# Patient Record
Sex: Female | Born: 1965 | Hispanic: No | Marital: Married | Smoking: Never smoker
Health system: Southern US, Community
[De-identification: ages and names within clinical notes are randomized; demographics above are authoritative.]

## PROBLEM LIST (undated history)

## (undated) DIAGNOSIS — E039 Hypothyroidism, unspecified: Secondary | ICD-10-CM

## (undated) DIAGNOSIS — I10 Essential (primary) hypertension: Secondary | ICD-10-CM

## (undated) DIAGNOSIS — Z87898 Personal history of other specified conditions: Secondary | ICD-10-CM

## (undated) DIAGNOSIS — E559 Vitamin D deficiency, unspecified: Secondary | ICD-10-CM

## (undated) DIAGNOSIS — D509 Iron deficiency anemia, unspecified: Secondary | ICD-10-CM

## (undated) DIAGNOSIS — G459 Transient cerebral ischemic attack, unspecified: Secondary | ICD-10-CM

## (undated) HISTORY — DX: Hypothyroidism, unspecified: E03.9

## (undated) HISTORY — DX: Transient cerebral ischemic attack, unspecified: G45.9

## (undated) HISTORY — DX: Essential (primary) hypertension: I10

## (undated) HISTORY — DX: Vitamin D deficiency, unspecified: E55.9

## (undated) HISTORY — DX: Iron deficiency anemia, unspecified: D50.9

## (undated) HISTORY — DX: Personal history of other specified conditions: Z87.898

---

## 2004-10-08 ENCOUNTER — Ambulatory Visit: Payer: Self-pay | Admitting: Internal Medicine

## 2004-10-09 ENCOUNTER — Ambulatory Visit: Payer: Self-pay | Admitting: Internal Medicine

## 2004-10-23 ENCOUNTER — Ambulatory Visit: Payer: Self-pay | Admitting: Internal Medicine

## 2004-10-30 ENCOUNTER — Ambulatory Visit: Payer: Self-pay | Admitting: *Deleted

## 2005-01-18 ENCOUNTER — Ambulatory Visit: Payer: Self-pay | Admitting: Family Medicine

## 2005-01-18 LAB — CONVERTED CEMR LAB

## 2005-01-31 ENCOUNTER — Ambulatory Visit: Payer: Self-pay | Admitting: Internal Medicine

## 2005-02-08 ENCOUNTER — Encounter: Admission: RE | Admit: 2005-02-08 | Discharge: 2005-02-08 | Payer: Self-pay | Admitting: Family Medicine

## 2007-07-22 ENCOUNTER — Encounter (INDEPENDENT_AMBULATORY_CARE_PROVIDER_SITE_OTHER): Payer: Self-pay | Admitting: *Deleted

## 2009-03-15 ENCOUNTER — Telehealth (INDEPENDENT_AMBULATORY_CARE_PROVIDER_SITE_OTHER): Payer: Self-pay | Admitting: Internal Medicine

## 2009-03-17 ENCOUNTER — Emergency Department (HOSPITAL_COMMUNITY): Admission: EM | Admit: 2009-03-17 | Discharge: 2009-03-17 | Payer: Self-pay | Admitting: Emergency Medicine

## 2009-03-31 ENCOUNTER — Ambulatory Visit: Payer: Self-pay | Admitting: Nurse Practitioner

## 2009-03-31 DIAGNOSIS — D649 Anemia, unspecified: Secondary | ICD-10-CM | POA: Insufficient documentation

## 2009-03-31 DIAGNOSIS — R5381 Other malaise: Secondary | ICD-10-CM | POA: Insufficient documentation

## 2009-03-31 DIAGNOSIS — J309 Allergic rhinitis, unspecified: Secondary | ICD-10-CM | POA: Insufficient documentation

## 2009-03-31 DIAGNOSIS — R5383 Other fatigue: Secondary | ICD-10-CM

## 2009-03-31 DIAGNOSIS — N3000 Acute cystitis without hematuria: Secondary | ICD-10-CM | POA: Insufficient documentation

## 2009-03-31 DIAGNOSIS — R002 Palpitations: Secondary | ICD-10-CM | POA: Insufficient documentation

## 2009-03-31 DIAGNOSIS — B351 Tinea unguium: Secondary | ICD-10-CM | POA: Insufficient documentation

## 2009-04-07 DIAGNOSIS — E559 Vitamin D deficiency, unspecified: Secondary | ICD-10-CM | POA: Insufficient documentation

## 2009-04-07 LAB — CONVERTED CEMR LAB
Albumin: 4.2 g/dL (ref 3.5–5.2)
Alkaline Phosphatase: 59 units/L (ref 39–117)
BUN: 9 mg/dL (ref 6–23)
Basophils Absolute: 0 10*3/uL (ref 0.0–0.1)
CO2: 24 meq/L (ref 19–32)
Creatinine, Ser: 0.56 mg/dL (ref 0.40–1.20)
Eosinophils Absolute: 0.1 10*3/uL (ref 0.0–0.7)
Eosinophils Relative: 1 % (ref 0–5)
HCT: 36.2 % (ref 36.0–46.0)
Hemoglobin: 11.2 g/dL — ABNORMAL LOW (ref 12.0–15.0)
Lymphocytes Relative: 17 % (ref 12–46)
MCHC: 30.9 g/dL (ref 30.0–36.0)
MCV: 88.7 fL (ref 78.0–100.0)
Platelets: 397 10*3/uL (ref 150–400)
Potassium: 4.5 meq/L (ref 3.5–5.3)
RDW: 14.1 % (ref 11.5–15.5)
Sodium: 141 meq/L (ref 135–145)
TSH: 3.381 microintl units/mL (ref 0.350–4.500)
Vit D, 25-Hydroxy: 27 ng/mL — ABNORMAL LOW (ref 30–89)
WBC: 7.5 10*3/uL (ref 4.0–10.5)

## 2009-04-11 ENCOUNTER — Encounter (INDEPENDENT_AMBULATORY_CARE_PROVIDER_SITE_OTHER): Payer: Self-pay | Admitting: *Deleted

## 2009-05-18 ENCOUNTER — Encounter (INDEPENDENT_AMBULATORY_CARE_PROVIDER_SITE_OTHER): Payer: Self-pay | Admitting: Nurse Practitioner

## 2009-05-18 ENCOUNTER — Ambulatory Visit: Payer: Self-pay | Admitting: Nurse Practitioner

## 2009-05-18 LAB — CONVERTED CEMR LAB

## 2009-05-19 ENCOUNTER — Encounter (INDEPENDENT_AMBULATORY_CARE_PROVIDER_SITE_OTHER): Payer: Self-pay | Admitting: Nurse Practitioner

## 2009-05-19 DIAGNOSIS — E039 Hypothyroidism, unspecified: Secondary | ICD-10-CM | POA: Insufficient documentation

## 2009-05-24 ENCOUNTER — Ambulatory Visit (HOSPITAL_COMMUNITY): Admission: RE | Admit: 2009-05-24 | Discharge: 2009-05-24 | Payer: Self-pay | Admitting: Internal Medicine

## 2009-05-29 ENCOUNTER — Encounter (INDEPENDENT_AMBULATORY_CARE_PROVIDER_SITE_OTHER): Payer: Self-pay | Admitting: Nurse Practitioner

## 2009-07-07 ENCOUNTER — Ambulatory Visit: Payer: Self-pay | Admitting: Nurse Practitioner

## 2009-07-11 ENCOUNTER — Encounter (INDEPENDENT_AMBULATORY_CARE_PROVIDER_SITE_OTHER): Payer: Self-pay | Admitting: Nurse Practitioner

## 2010-05-28 ENCOUNTER — Ambulatory Visit (HOSPITAL_COMMUNITY): Admission: RE | Admit: 2010-05-28 | Discharge: 2010-05-28 | Payer: Self-pay | Admitting: Internal Medicine

## 2010-06-22 IMAGING — MG MM DIGITAL SCREENING BILAT
4 series · 4 of 4 positions shown · non-contrast
Comparison: none

DG SCREEN MAMMOGRAM BILATERAL
Bilateral CC and MLO view(s) were taken.
Technologist: Jose Enrique Joo, RT RM

DIGITAL SCREENING MAMMOGRAM WITH CAD:
There are scattered fibroglandular densities.  No masses or malignant type calcifications are 
identified.  Compared with prior studies.
Images were processed with CAD.

[R CC]
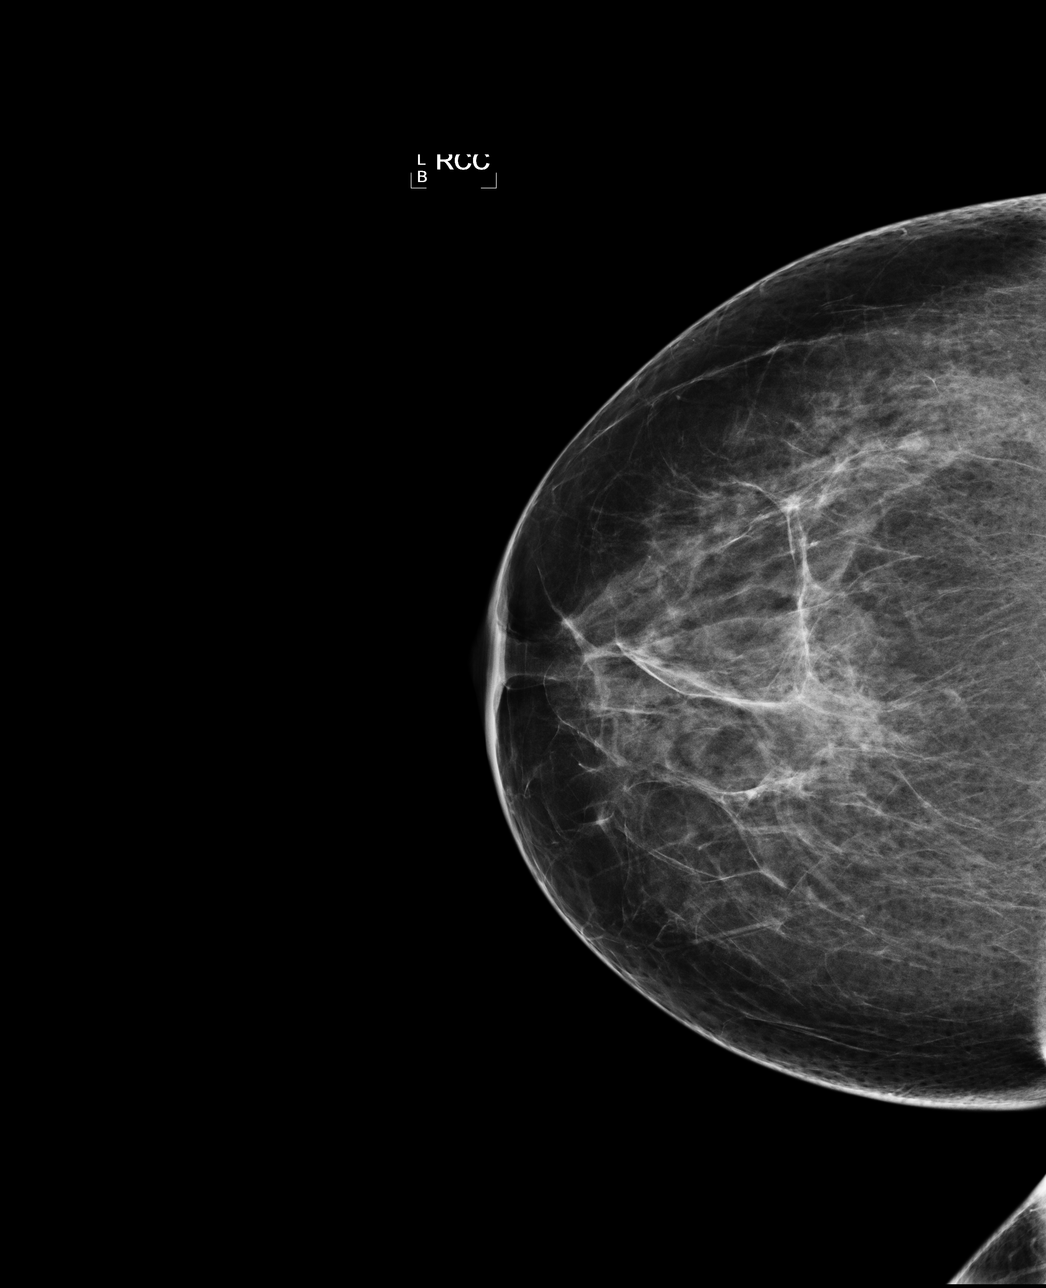

[R MLO]
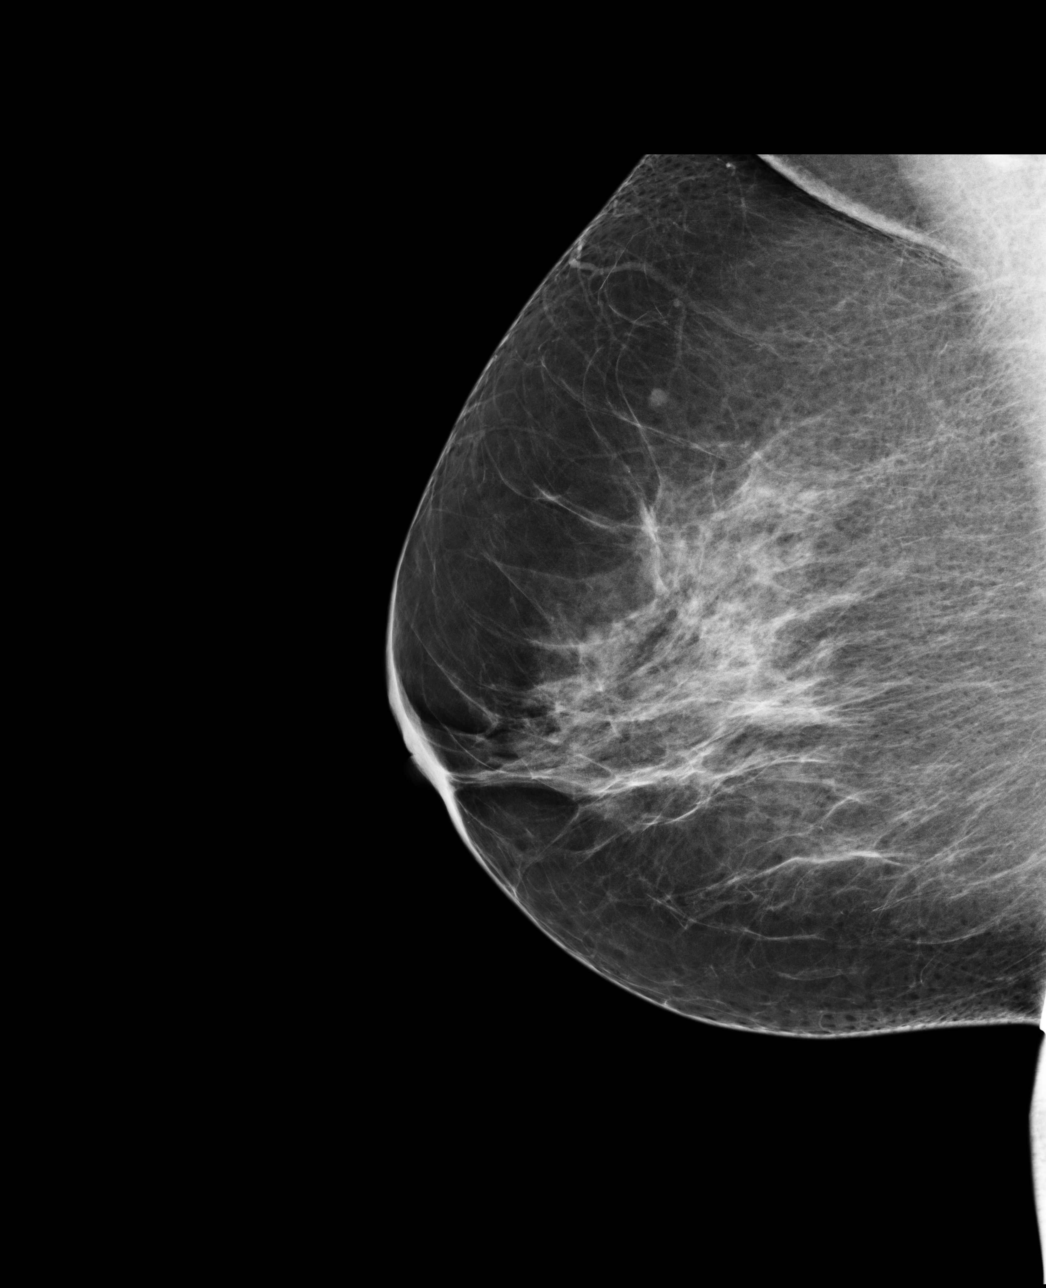

[L CC]
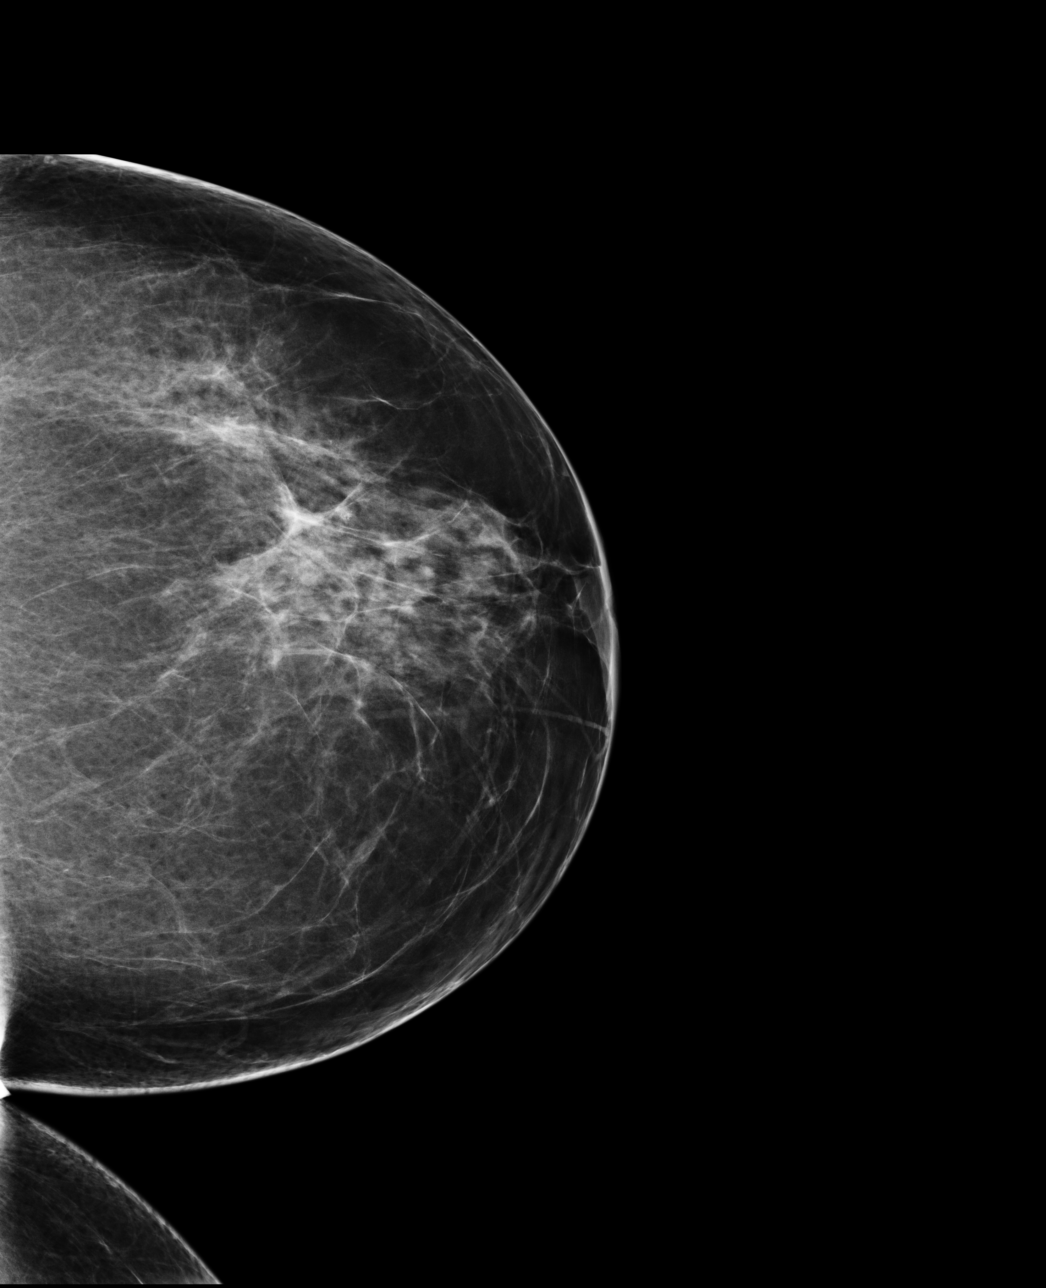

[L MLO]
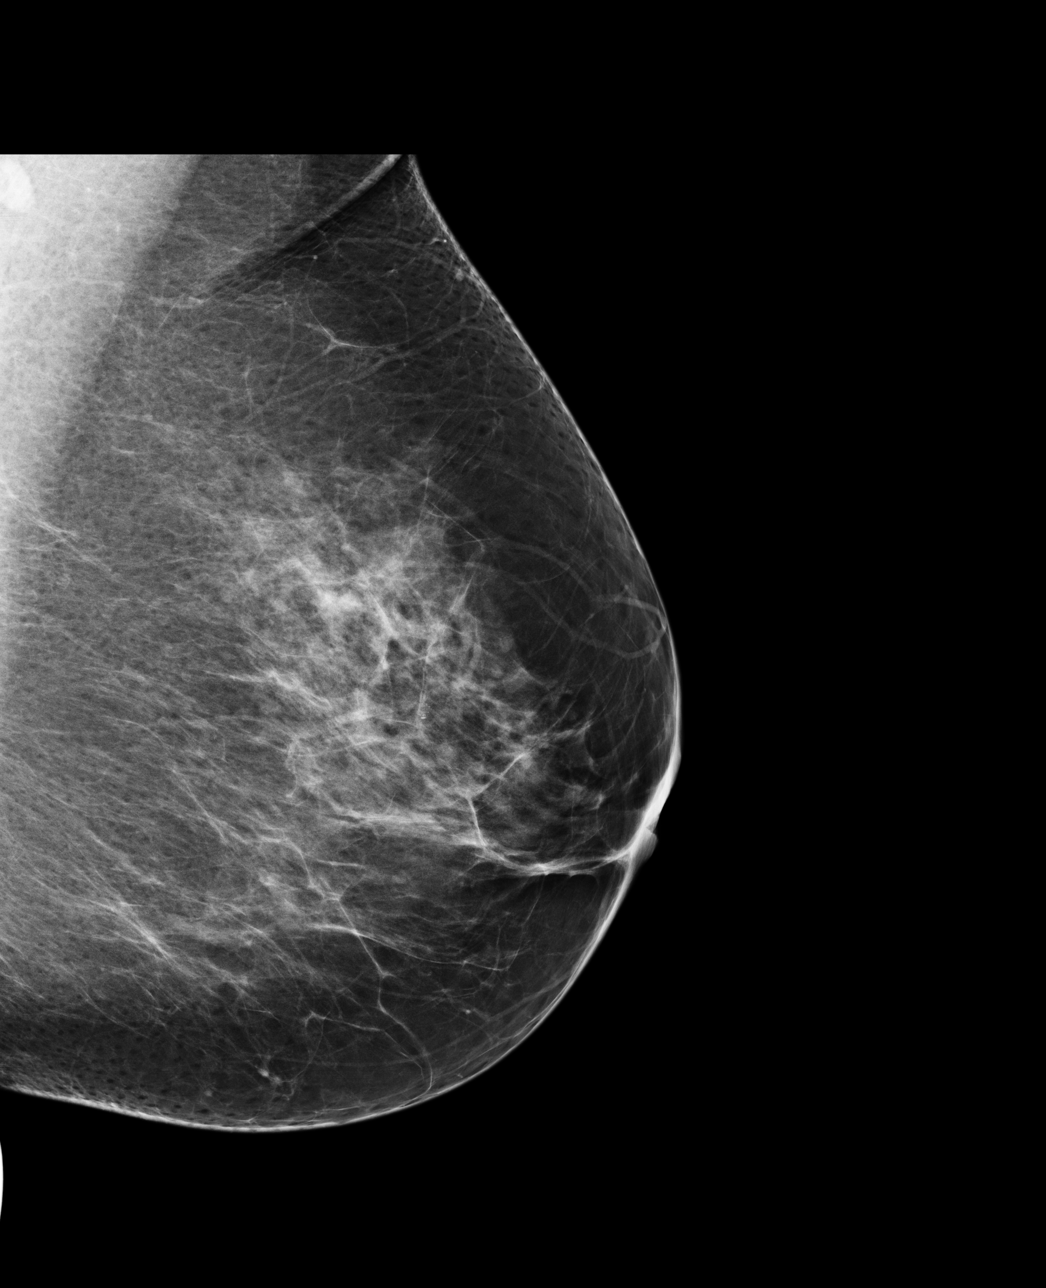

[4 of 4 positions shown; findings below may reference images not displayed]

IMPRESSION: No specific mammographic evidence of malignancy.  Next screening mammogram is recommended in one 
year.

A result letter of this screening mammogram will be mailed directly to the patient.

ASSESSMENT: Negative - BI-RADS 1

Screening mammogram in 1 year.
ANALYZED BY COMPUTER AIDED DETECTION. , THIS PROCEDURE WAS A DIGITAL MAMMOGRAM.

## 2010-10-19 ENCOUNTER — Encounter (INDEPENDENT_AMBULATORY_CARE_PROVIDER_SITE_OTHER): Payer: Self-pay | Admitting: Nurse Practitioner

## 2010-10-19 ENCOUNTER — Ambulatory Visit: Payer: Self-pay | Admitting: Nurse Practitioner

## 2010-10-19 DIAGNOSIS — N926 Irregular menstruation, unspecified: Secondary | ICD-10-CM | POA: Insufficient documentation

## 2010-10-19 DIAGNOSIS — K029 Dental caries, unspecified: Secondary | ICD-10-CM | POA: Insufficient documentation

## 2010-10-19 LAB — CONVERTED CEMR LAB
KOH Prep: NEGATIVE
OCCULT 1: NEGATIVE
Protein, U semiquant: NEGATIVE
Rapid HIV Screen: NEGATIVE
Specific Gravity, Urine: >=1.03
Urobilinogen, UA: 0.2
WBC Urine, dipstick: NEGATIVE
pH: 5

## 2010-10-22 ENCOUNTER — Encounter (INDEPENDENT_AMBULATORY_CARE_PROVIDER_SITE_OTHER): Payer: Self-pay | Admitting: Nurse Practitioner

## 2010-10-22 ENCOUNTER — Telehealth (INDEPENDENT_AMBULATORY_CARE_PROVIDER_SITE_OTHER): Payer: Self-pay | Admitting: Nurse Practitioner

## 2010-10-22 ENCOUNTER — Ambulatory Visit: Payer: Self-pay | Admitting: Nurse Practitioner

## 2010-10-22 LAB — CONVERTED CEMR LAB
AST: 17 units/L (ref 0–37)
Albumin: 4.3 g/dL (ref 3.5–5.2)
Chlamydia, DNA Probe: NEGATIVE
Chloride: 101 meq/L (ref 96–112)
Creatinine, Ser: 0.57 mg/dL (ref 0.40–1.20)
Ferritin: 10 ng/mL (ref 10–291)
Folate: >20 ng/mL
GC Probe Amp, Genital: NEGATIVE
HCT: 35.8 % — ABNORMAL LOW (ref 36.0–46.0)
HDL: 44 mg/dL (ref >39)
Hemoglobin: 11.3 g/dL — ABNORMAL LOW (ref 12.0–15.0)
Iron: 73 ug/dL (ref 42–145)
Lymphocytes Relative: 20 % (ref 12–46)
Lymphs Abs: 1.6 10*3/uL (ref 0.7–4.0)
MCV: 88.4 fL (ref 78.0–100.0)
Microalb, Ur: 1.16 mg/dL (ref 0.00–1.89)
Monocytes Absolute: 0.6 10*3/uL (ref 0.1–1.0)
Monocytes Relative: 8 % (ref 3–12)
Neutrophils Relative %: 70 % (ref 43–77)
Platelets: 412 10*3/uL — ABNORMAL HIGH (ref 150–400)
UIBC: 285 ug/dL
Vit D, 25-Hydroxy: 30 ng/mL (ref 30–89)
Vitamin B-12: 428 pg/mL (ref 211–911)

## 2010-10-23 ENCOUNTER — Encounter (INDEPENDENT_AMBULATORY_CARE_PROVIDER_SITE_OTHER): Payer: Self-pay | Admitting: Nurse Practitioner

## 2010-10-24 ENCOUNTER — Encounter (INDEPENDENT_AMBULATORY_CARE_PROVIDER_SITE_OTHER): Payer: Self-pay | Admitting: Nurse Practitioner

## 2010-11-09 ENCOUNTER — Encounter (INDEPENDENT_AMBULATORY_CARE_PROVIDER_SITE_OTHER): Payer: Self-pay | Admitting: Nurse Practitioner

## 2010-12-02 LAB — CONVERTED CEMR LAB
ALT: 12 units/L (ref 0–35)
AST: 15 units/L (ref 0–37)
CO2: 20 meq/L (ref 19–32)
Chlamydia, DNA Probe: NEGATIVE
Cholesterol: 168 mg/dL (ref 0–200)
Eosinophils Absolute: 0.1 10*3/uL (ref 0.0–0.7)
Eosinophils Relative: 1 % (ref 0–5)
GC Probe Amp, Genital: NEGATIVE
HCT: 36.1 % (ref 36.0–46.0)
HDL: 45 mg/dL (ref >39)
Hemoglobin: 11.3 g/dL — ABNORMAL LOW (ref 12.0–15.0)
Ketones, urine, test strip: NEGATIVE
LDL Cholesterol: 94 mg/dL (ref 0–99)
Lymphocytes Relative: 16 % (ref 12–46)
Lymphs Abs: 1.3 10*3/uL (ref 0.7–4.0)
MCHC: 31.3 g/dL (ref 30.0–36.0)
Monocytes Absolute: 0.6 10*3/uL (ref 0.1–1.0)
Neutro Abs: 6 10*3/uL (ref 1.7–7.7)
Nitrite: NEGATIVE
OCCULT 1: NEGATIVE
Potassium: 4.5 meq/L (ref 3.5–5.3)
RBC: 4.04 M/uL (ref 3.87–5.11)
Specific Gravity, Urine: 1.01
Total CHOL/HDL Ratio: 3.7
Total Protein: 7 g/dL (ref 6.0–8.3)
Triglycerides: 146 mg/dL (ref <150)
Urobilinogen, UA: 1
WBC Urine, dipstick: NEGATIVE
WBC: 7.9 10*3/uL (ref 4.0–10.5)
pH: 6

## 2010-12-06 NOTE — Assessment & Plan Note (Signed)
Summary: Complete Physical Exam    Vital Signs:  Patient profile:   45 year old female Menstrual status:  regular LMP:     09/15/2010 Weight:      162.5 pounds BMI:     29.59 Temp:     98.6 degrees F oral Pulse rate:   72 / minute Pulse rhythm:   regular Resp:     16 per minute BP sitting:   130 / 76  (left arm) Cuff size:   regular  Vitals Entered By: Levon Hedger (October 19, 2010 8:51 AM)  Nutrition Counseling: Patient's BMI is greater than 25 and therefore counseled on weight management options. CC: CPP.Marland KitchenMarland Kitchenpt has been without medications for 6 months Is Patient Diabetic? No Pain Assessment Patient in pain? no       Does patient need assistance? Functional Status Self care Ambulation Normal  Vision Screening:Left eye w/o correction: 20 / 20-1 Right Eye w/o correction: 20 / 20-1 Both eyes w/o correction:  20/ 20-1        Vision Entered By: Levon Hedger (October 19, 2010 9:23 AM) LMP (date): 09/15/2010 LMP - Character: normal     Menstrual flow (days): 5 Menstrual Status regular Enter LMP: 09/15/2010 Last PAP Result  Specimen Adequacy: Satisfactory for evaluation.   Interpretation/Result:Negative for intraepithelial Lesion or Malignancy.      CC:  CPP.Marland KitchenMarland Kitchenpt has been without medications for 6 months.  History of Present Illness:  Pt into the office for complete physical exam  PAP - Last done 1year ago.  Normal results 3 children and married (husband present with pt today)  Mammogram - last done in july 2011  no family history of breast cancer  Optho - No glasses or contacts. No recent eye exam   Dental - some broken teeth and pt requesting a dental referral  Obesity - up 5 pounds since the last visit.    Family Planning - LMP 09/15/2010 - pt is concerned that she is 4 days late for her cycle.  No current birth control method.  Habits & Providers  Alcohol-Tobacco-Diet     Alcohol drinks/day: 0     Tobacco Status:  never  Exercise-Depression-Behavior     Does Patient Exercise: no     Have you felt down or hopeless? no     Have you felt little pleasure in things? no     Depression Counseling: not indicated; screening negative for depression     Drug Use: never  Allergies (verified): No Known Drug Allergies  Social History: Does Patient Exercise:  no  Review of Systems General:  Denies fever. Eyes:  Denies blurring. ENT:  Denies earache. CV:  Complains of palpitations; pt has been off her atenolol for about 3 months. . Resp:  Denies cough. GI:  Denies abdominal pain, nausea, and vomiting. GU:  Denies discharge. MS:  Denies joint pain. Derm:  Denies dryness. Neuro:  Denies headaches. Psych:  Denies anxiety and depression.  Physical Exam  General:  alert.   Head:  normocephalic.   Eyes:  pupils equal and pupils round.   Ears:  bil TM with bony landmarks present Nose:  nose piercing noted.   Mouth:  fair dentition.   upper partial but left upper molar broken Neck:  supple.   Chest Wall:  no mass.   Breasts:  nipple Lungs:  normal breath sounds.   Heart:  normal rate and regular rhythm.   Abdomen:  non-tender and normal bowel sounds.   Rectal:  external hemorrhoid(s).  Msk:  up to the exam table Extremities:  no edema Neurologic:  alert & oriented X3 and gait normal.   Skin:  color normal.   Psych:  Oriented X3.    Pelvic Exam  Vulva:      normal appearance.   Urethra and Bladder:      Urethra--no discharge.   Vagina:      physiologic discharge.   Cervix:      midposition.   Uterus:      smooth.   Adnexa:      nontender bilaterally.   Rectum:      normal, heme negative stool, + external hemorrhoids.      Impression & Recommendations:  Problem # 1:  ROUTINE GYNECOLOGICAL EXAMINATION (ICD-V72.31) labs done except lipids (pt is not fasting) rec optho and dental exam PAP done  mammogram already done for this year Orders: KOH/ WET Mount [phone]) Pap Smear, Thin  Prep ( Collection of) [phone]) T- GC Chlamydia (72536) T-Urine Microalbumin w/creat. ratio [phone]) T-Comprehensive Metabolic Panel [phone]) T-CBC w/Diff (18841-66063) Rapid HIV  (92370) Hemoccult Guaiac-1 spec.(in office) (82270) Vision Screening (01601)  Problem # 2:  NEED PROPHYLACTIC VACCINATION&INOCULATION FLU (ICD-V04.81) given today in office  Problem # 3:  HYPOTHYROIDISM (ICD-244.9) will check labs today Orders: T-TSH ([id number])  Problem # 4:  PALPITATIONS (ICD-785.1) sinus tachy today on EKG will restart atenolol Her updated medication list for this problem includes:    Atenolol 25 Mg Tabs (Atenolol) ..... One tablet by mouth daily for heart  Orders: EKG w/ Interpretation (93000)  Problem # 5:  IRREGULAR MENSES (ICD-626.4) urine pregnancy negative advised pt that she should look to getting an IUD - handout given Orders: Urine Pregnancy Test  (09323)  Complete Medication List: 1)  Allegra 180 Mg Tabs (Fexofenadine hcl) .Marland Kitchen.. 1 tablet daily as needed for allergies 2)  Atenolol 25 Mg Tabs (Atenolol) .... One tablet by mouth daily for heart  Other Orders: Dental Referral (Dentist) T-Vitamin D 25-Hydroxy & 1,25 Dihydroxy (8147) Flu Vaccine 77yrs + (55732) Admin 1st Vaccine (20254)  Patient Instructions: 1)  Schedule a lab visit on Monday for fasting lab - lipids 2)  No food after midnight before this visit.  No coffee with milk.  May drink water. 3)  You should consider a long term birth control such as IUD given your age.  You are starting to have some changes with your period that may vary from month to month. 4)  You will be referred to the Dental Clinic - they will call you with time/date of the appointment 5)  You have been given the flu vaccine today. 6)  Follow up with provider yearly or sooner if necessary Prescriptions: ATENOLOL 25 MG TABS (ATENOLOL) One tablet by mouth daily for heart  #30 x 11   Entered and Authorized by:   Lehman Prom FNP   Signed by:   Lehman Prom FNP on 10/19/2010   Method used:   Print then Give to Patient   RxID:   [id number]    Orders Added: 1)  Est. Patient age 44-64 [99396] 2)  EKG w/ Interpretation [93000] 3)  KOH/ WET Mount [16073] 4)  Pap Smear, Thin Prep ( Collection of) [Q0091] 5)  T- GC Chlamydia [71062] 6)  T-Urine Microalbumin w/creat. ratio [[id number]] 7)  T-Comprehensive Metabolic Panel [80053-22900] 8)  T-CBC w/Diff [69485-46270] 9)  Rapid HIV  [92370] 10)  T-TSH [35009-38182] 11)  Hemoccult Guaiac-1 spec.(in office) [82270] 12)  Vision Screening [99173] 13)  Dental Referral [Dentist] 14)  T-Vitamin D 25-Hydroxy & 1,25 Dihydroxy [8147] 15)  Urine Pregnancy Test  [81025] 16)  Flu Vaccine 71yrs + [90658] 17)  Admin 1st Vaccine [90471]   Immunizations Administered:  Influenza Vaccine # 1:    Vaccine Type: Fluvax 3+    Site: left deltoid    Mfr: GlaxoSmithKline    Dose: 0.5 ml    Route: IM    Given by: Gaylyn Cheers RN    Exp. Date: 05/04/2011    Lot #: [id number]    VIS given: 05/29/10 version given October 19, 2010.  Flu Vaccine Consent Questions:    Do you have a history of severe allergic reactions to this vaccine? no    Any prior history of allergic reactions to egg and/or gelatin? no    Do you have a sensitivity to the preservative Thimersol? no    Do you have a past history of Guillan-Barre Syndrome? no    Do you currently have an acute febrile illness? no    Have you ever had a severe reaction to latex? no    Vaccine information given and explained to patient? yes    Are you currently pregnant? no   Immunizations Administered:  Influenza Vaccine # 1:    Vaccine Type: Fluvax 3+    Site: left deltoid    Mfr: GlaxoSmithKline    Dose: 0.5 ml    Route: IM    Given by: Gaylyn Cheers RN    Exp. Date: 05/04/2011    Lot #: [id number]    VIS given: 05/29/10 version given October 19, 2010.  Prevention & Chronic  Care Immunizations   Influenza vaccine: Fluvax 3+  (10/19/2010)    Tetanus booster: 11/05/2003: given in [place]    Pneumococcal vaccine: Not documented  Other Screening   Pap smear:  Specimen Adequacy: Satisfactory for evaluation.   Interpretation/Result:Negative for intraepithelial Lesion or Malignancy.     (05/18/2009)    Mammogram: ASSESSMENT: Negative - BI-RADS 1^MM DIGITAL SCREENING  (05/28/2010)   Mammogram action/deferral: Screening mammogram in 1 year.     (02/08/2005)   Smoking status: never  (10/19/2010)  Lipids   Total Cholesterol: 168  (05/18/2009)   LDL: 94  (05/18/2009)   LDL Direct: Not documented   HDL: 45  (05/18/2009)   Triglycerides: 146  (05/18/2009)   Nursing Instructions: Give Flu vaccine today    EKG  Procedure date:  10/19/2010  Findings:      sinus tachy   Laboratory Results   Urine Tests  Date/Time Received: October 19, 2010 10:40 AM   Routine Urinalysis   Color: lt. yellow Glucose: negative   (Normal Range: Negative) Bilirubin: negative   (Normal Range: Negative) Ketone: negative   (Normal Range: Negative) Spec. Gravity: >=1.030   (Normal Range: 1.003-1.035) Blood: moderate   (Normal Range: Negative) pH: 5.0   (Normal Range: 5.0-8.0) Protein: negative   (Normal Range: Negative) Urobilinogen: 0.2   (Normal Range: 0-1) Nitrite: negative   (Normal Range: Negative) Leukocyte Esterace: negative   (Normal Range: Negative)    Date/Time Received: October 19, 2010 10:14 AM   Wet Mount Source: vaginal WBC/hpf: 1-5 Bacteria/hpf: rare Clue cells/hpf: none Yeast/hpf: none Wet Mount KOH: Negative Trichomonas/hpf: none  Other Tests  Rapid HIV: negative  Stool - Occult Blood Hemmoccult #1: negative Date: 10/19/2010      Laboratory Results   Urine Tests    Routine Urinalysis   Color: lt. yellow Glucose: negative   (Normal Range: Negative) Bilirubin: negative   (  Normal Range: Negative) Ketone: negative    (Normal Range: Negative) Spec. Gravity: >=1.030   (Normal Range: 1.003-1.035) Blood: moderate   (Normal Range: Negative) pH: 5.0   (Normal Range: 5.0-8.0) Protein: negative   (Normal Range: Negative) Urobilinogen: 0.2   (Normal Range: 0-1) Nitrite: negative   (Normal Range: Negative) Leukocyte Esterace: negative   (Normal Range: Negative)      Wet Mount/KOH  Other Tests  Rapid HIV: negative

## 2010-12-06 NOTE — Progress Notes (Signed)
Summary: Office Visit//DEPRESSION SCREENING  Office Visit//DEPRESSION SCREENING   Imported By: Arta Bruce 10/19/2010 10:52:34  _____________________________________________________________________  External Attachment:    Type:   Image     Comment:   External Document

## 2010-12-06 NOTE — Letter (Signed)
Summary: Handout Printed  Printed Handout:  - Contraceptive Devices (IUD's)

## 2010-12-06 NOTE — Progress Notes (Signed)
Summary: Anemia  Phone Note Outgoing Call   Summary of Call: advise pt that she is anemic she will need to start ferrous sulfate 325mg  by mouth daily. She can buy this over the counter  Initial call taken by: Lehman Prom FNP,  October 22, 2010 4:29 PM  Follow-up for Phone Call        Levon Hedger  October 24, 2010 12:59 PM Left message on machine for pt to return call to the office.  LM on [phone] for pt. to call us.... Hale Drone CMA  October 26, 2010 8:59 AM   Additional Follow-up for Phone Call Additional follow up Details #1::        Left message on answer machine for pt. to return call. Gaylyn Cheers RN  October 30, 2010 2:39 PM        Additional Follow-up for Phone Call Additional follow up Details #2::    PATIENT RETURNED CALL AND SPOKE WITH HER SON AND SHE IS AWARE OF HER ABOVE INFO. Follow-up by: Leodis Rains,  October 31, 2010 10:29 AM  New/Updated Medications: FERROUS SULFATE 325 (65 FE) MG TBEC (FERROUS SULFATE) One tablet by mouth daily to build up blood Phone Note Outgoing Call   Summary of Call: advise pt that she is anemic she will need to start ferrous sulfate 325mg  by mouth daily. She can buy this over the counter  Initial call taken by: Lehman Prom FNP,  October 22, 2010 4:29 PM  Follow-up for Phone Call        Levon Hedger  October 24, 2010 12:59 PM Left message on machine for pt to return call to the office.  LM on [phone] for pt. to call us.... Hale Drone CMA  October 26, 2010 8:59 AM   Additional Follow-up for Phone Call Additional follow up Details #1::        Left message on answer machine for pt. to return call. Gaylyn Cheers RN  October 30, 2010 2:39 PM        Additional Follow-up for Phone Call Additional follow up Details #2::    PATIENT RETURNED CALL AND SPOKE WITH HER SON AND SHE IS AWARE OF HER ABOVE INFO. Follow-up by: Leodis Rains,  October 31, 2010 10:29 AM  New/Updated Medications: FERROUS  SULFATE 325 (65 FE) MG TBEC (FERROUS SULFATE) One tablet by mouth daily to build up blood

## 2010-12-06 NOTE — Letter (Signed)
Summary: *HSN Results Follow up  [facility]  [address] [place], [place] [address]   Phone: [phone]  Fax: [phone]      10/24/2010   [facility] [address] [place], [place]  [address]   Dear  Ms. Shirley Craig,                            ____S.Drinkard,FNP   ____D. Gore,FNP       ____B. McPherson,MD   ____V. Rankins,MD    ____E. Mulberry,MD    _X___N. Daphine Deutscher, FNP  ____D. Reche Dixon, MD    ____K. Philipp Deputy, MD    ____Other     This letter is to inform you that your recent test(s):  ___X____Pap Smear    _______Lab Test     _______X-ray    ___X____ is within acceptable limits  _______ requires a medication change  _______ requires a follow-up lab visit  _______ requires a follow-up visit with your [name]   Comments: Pap Smear results are normal.       _________________________________________________________ If you have any questions, please contact our office [phone].                    Sincerely,    Shirley Prom FNP [facility]

## 2010-12-06 NOTE — Letter (Signed)
Summary: DENTAL REFERRAL  DENTAL REFERRAL   Imported By: Arta Bruce 11/06/2010 16:30:54  _____________________________________________________________________  External Attachment:    Type:   Image     Comment:   External Document

## 2010-12-06 NOTE — Letter (Signed)
Summary: Lipid Letter  [facility]  [address] [place], [place] [address]   Phone: [phone]  Fax: [phone]    10/23/2010  [facility] [address] [place] [place], [place]  [address]  Dear Shirley Craig:  We have carefully reviewed your last lipid profile from 10/22/2010 and the results are noted below with a summary of recommendations for lipid management.    Cholesterol:       187     Goal: less than 200   HDL "good" Cholesterol:   44     Goal: greater than 40   LDL "bad" Cholesterol:   124     Goal: less than 130   Triglycerides:       96     Goal: less than 150    Your cholesterol labs are normal.    Current Medications: 1)    Allegra 180 Mg Tabs (Fexofenadine hcl) .Marland Kitchen.. 1 tablet daily as needed for allergies 2)    Atenolol 25 Mg Tabs (Atenolol) .... One tablet by mouth daily for heart  If you have any questions, please call. We appreciate being able to work with you.   Sincerely,    [facility] Lehman Prom FNP

## 2011-02-12 LAB — URINALYSIS, ROUTINE W REFLEX MICROSCOPIC
Bilirubin Urine: NEGATIVE
Ketones, ur: NEGATIVE mg/dL
Nitrite: NEGATIVE
Protein, ur: 100 mg/dL — AB

## 2011-02-12 LAB — URINE CULTURE

## 2011-02-12 LAB — URINE MICROSCOPIC-ADD ON

## 2015-11-15 ENCOUNTER — Other Ambulatory Visit: Payer: Self-pay | Admitting: Family Medicine

## 2015-11-15 DIAGNOSIS — R1012 Left upper quadrant pain: Secondary | ICD-10-CM

## 2015-11-17 ENCOUNTER — Ambulatory Visit
Admission: RE | Admit: 2015-11-17 | Discharge: 2015-11-17 | Disposition: A | Discharge status: Home / Self Care | Payer: BLUE CROSS/BLUE SHIELD | Source: Ambulatory Visit | Attending: Family Medicine | Admitting: Family Medicine

## 2015-11-17 DIAGNOSIS — R1012 Left upper quadrant pain: Secondary | ICD-10-CM

## 2017-01-20 ENCOUNTER — Other Ambulatory Visit: Payer: Self-pay | Admitting: Physician Assistant

## 2017-01-20 ENCOUNTER — Other Ambulatory Visit (HOSPITAL_COMMUNITY)
Admission: RE | Admit: 2017-01-20 | Discharge: 2017-01-20 | Disposition: A | Discharge status: Home / Self Care | Payer: BLUE CROSS/BLUE SHIELD | Source: Ambulatory Visit | Attending: Family Medicine | Admitting: Family Medicine

## 2017-01-20 DIAGNOSIS — Z01419 Encounter for gynecological examination (general) (routine) without abnormal findings: Secondary | ICD-10-CM | POA: Diagnosis present

## 2017-01-22 LAB — CYTOLOGY - PAP: DIAGNOSIS: NEGATIVE

## 2017-07-09 ENCOUNTER — Other Ambulatory Visit: Payer: Self-pay | Admitting: Physician Assistant

## 2017-07-09 ENCOUNTER — Ambulatory Visit
Admission: RE | Admit: 2017-07-09 | Discharge: 2017-07-09 | Disposition: A | Discharge status: Home / Self Care | Payer: BLUE CROSS/BLUE SHIELD | Source: Ambulatory Visit | Attending: Physician Assistant | Admitting: Physician Assistant

## 2017-07-09 DIAGNOSIS — S99912A Unspecified injury of left ankle, initial encounter: Secondary | ICD-10-CM

## 2018-11-25 ENCOUNTER — Emergency Department (HOSPITAL_COMMUNITY)
Admission: EM | Admit: 2018-11-25 | Discharge: 2018-11-25 | Disposition: A | Discharge status: Home / Self Care | Payer: BLUE CROSS/BLUE SHIELD | Attending: Emergency Medicine | Admitting: Emergency Medicine

## 2018-11-25 ENCOUNTER — Other Ambulatory Visit: Payer: Self-pay

## 2018-11-25 ENCOUNTER — Emergency Department (HOSPITAL_COMMUNITY): Payer: BLUE CROSS/BLUE SHIELD

## 2018-11-25 ENCOUNTER — Encounter (HOSPITAL_COMMUNITY): Payer: Self-pay

## 2018-11-25 DIAGNOSIS — Y92410 Unspecified street and highway as the place of occurrence of the external cause: Secondary | ICD-10-CM | POA: Insufficient documentation

## 2018-11-25 DIAGNOSIS — E039 Hypothyroidism, unspecified: Secondary | ICD-10-CM | POA: Insufficient documentation

## 2018-11-25 DIAGNOSIS — Y9389 Activity, other specified: Secondary | ICD-10-CM | POA: Insufficient documentation

## 2018-11-25 DIAGNOSIS — S20219A Contusion of unspecified front wall of thorax, initial encounter: Secondary | ICD-10-CM | POA: Insufficient documentation

## 2018-11-25 DIAGNOSIS — Y999 Unspecified external cause status: Secondary | ICD-10-CM | POA: Insufficient documentation

## 2018-11-25 DIAGNOSIS — R030 Elevated blood-pressure reading, without diagnosis of hypertension: Secondary | ICD-10-CM | POA: Insufficient documentation

## 2018-11-25 NOTE — ED Notes (Signed)
Patient instructed on use of incentive spirometer. Teach back method used. 
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
## 2018-11-25 NOTE — Discharge Instructions (Signed)
Please use incentive spirometer to help keep your lungs healthy Take Ibuprofen for pain as needed Use a heating pad on sore areas Please follow up with your doctor Return if worsening

## 2021-09-17 NOTE — Progress Notes (Signed)
NEUROLOGY CONSULTATION NOTE  Shirley Craig MRN: [id number] DOB: April 14, 1966   Referring provider: Almedia Balls, NP   Primary care provider: Almedia Balls, NP     Reason for consult:  TIA   Assessment/Plan:   TIA history of TIA July 04, 2021, with resolution, no recurrence.  Recent history of long plane ride.  CT head, 2D echo, carotid ultrasound unremarkable.  Lipid panel normal, LDL 66.  History of bradycardia and palpitations.  Patient on daily baby aspirin  Recommendations Check MRI/MRA head  echo bubble study to rule out PFO CT angio chest rule out PE MRA/MRI brain to evaluate structural abnormalities and vascular load Bilateral lower extremity Doppler to rule out clot  Patient is scheduled to follow-up with cardiology regarding history of bradycardia Follow-up on anemia with PCP  Subjective:   Shirley Craig is a pleasant 55 y.o. [place] woman with a history of Vit D deficiency, history of bradycardia, hypertension anemia referred by Almedia Balls, NP for continuation of care after having a TIA while in [place] on 07/04/2021, 24 hours after she had arrived to [place] in an 8-hour ride by plane.  During the plane ride, she experienced left lower extremity swelling, and pain at the ankle area, as well as chest tightness.  Very mild shortness of breath.  No dizziness or vertigo.  On presentation to the hospital, the patient had right arm weakness and numbness and tingling in her fingers, as well as numbness paralysis on the right side of her face.  She denies any trouble swallowing, or vision changes such as amaurosis fugax.  She denies any headaches.  No loss of consciousness.  The experience lasted about 2 minutes, with complete resolution of the symptoms.  At the hospital, CT of the head, 2D echo and carotid ultrasound, as well as EKG were performed, and with the exception of sinus bradycardia, the rest of the work-up was negative.  She was discharged on baby  aspirin called Aspegic and upon return, this was changed to 81 mg ASA.  She denies any fever, chills night sweats, she denies any tobacco, or alcohol abuse.  She denies recreational drug intake.    Labs 09/12/2021: Iron 43, TIBC 356, iron saturation 12, Hemoglobin 11.5, hematocrit 34.3, MCV 85.9, platelets 423 CMP normal TSH 3.57 normal Vitamin D 2543, normal Lipid panel normal  PAST MEDICAL HISTORY: Past Medical History:  Diagnosis Date   History of bradycardia    History of palpitations    Hypertension    Hypothyroidism    Iron deficiency anemia    TIA (transient ischemic attack)    Vitamin D deficiency     PAST SURGICAL HISTORY: History reviewed. No pertinent surgical history.  MEDICATIONS: Current Outpatient Medications on File Prior to Visit  Medication Sig Dispense Refill   aspirin EC 81 MG tablet Take 81 mg by mouth daily. Swallow whole.     candesartan (ATACAND) 8 MG tablet Take 8 mg by mouth daily.     FEROSUL 325 (65 Fe) MG tablet Take 325 mg by mouth daily.     rosuvastatin (CRESTOR) 10 MG tablet Take 10 mg by mouth daily.     No current facility-administered medications on file prior to visit.    ALLERGIES: No Known Allergies  FAMILY HISTORY: Family History  Problem Relation Age of Onset   Hypertension Mother    Hypertension Father      Objective:   General: No acute distress.  Patient appears well-groomed.   Head:  Normocephalic/atraumatic Eyes:  fundi examined but not visualized Neck: supple, no paraspinal tenderness, full range of motion Back: No paraspinal tenderness Heart: regular rate and rhythm Lungs: Clear to auscultation bilaterally. Vascular: No carotid bruits. Neurological Exam: Mental status: alert and oriented to person, place, and time, recent and remote memory intact, fund of knowledge intact, attention and concentration intact, speech fluent and not dysarthric, language intact. Cranial nerves: CN I: not tested CN II: pupils equal,  round and reactive to light, visual fields intact CN III, IV, VI:  full range of motion, no nystagmus, no ptosis CN V: facial sensation intact. CN VII: upper and lower face symmetric CN VIII: hearing intact CN IX, X: gag intact, uvula midline CN XI: sternocleidomastoid and trapezius muscles intact CN XII: tongue midline Bulk & Tone: normal, no fasciculations. Motor:  muscle strength 5/5 throughout Sensation:  Pinprick, temperature and vibratory sensation intact. Deep Tendon Reflexes:  2+ throughout,  toes downgoing.   Finger to nose testing:  Without dysmetria.   Heel to shin:  Without dysmetria.   Gait:  Normal station and stride.  Romberg negative.    Thank you for allowing me to take part in the care of this patient.  Marlowe Kays, PA-C   CC: Carilyn Goodpasture, NP

## 2021-09-18 ENCOUNTER — Ambulatory Visit (INDEPENDENT_AMBULATORY_CARE_PROVIDER_SITE_OTHER): Payer: 59 | Admitting: Physician Assistant

## 2021-09-18 ENCOUNTER — Encounter: Payer: Self-pay | Admitting: Physician Assistant

## 2021-09-18 ENCOUNTER — Other Ambulatory Visit: Payer: Self-pay

## 2021-09-18 DIAGNOSIS — Z8673 Personal history of transient ischemic attack (TIA), and cerebral infarction without residual deficits: Secondary | ICD-10-CM | POA: Insufficient documentation

## 2021-09-18 NOTE — Patient Instructions (Addendum)
Follow up in 3 months Continue Baby Aspirin daily  Echo Bubble study for PFO CT A  chest B lower extremity dopplers to check the legs  MRI/MRA brain to look for structure and vessels  Exercise often Follow with Cardiology for bradycardia ad palpitations Follow up on your anemia with your primary doctor

## 2021-09-19 ENCOUNTER — Other Ambulatory Visit: Payer: Self-pay | Admitting: Family Medicine

## 2021-09-19 DIAGNOSIS — Z1231 Encounter for screening mammogram for malignant neoplasm of breast: Secondary | ICD-10-CM

## 2021-09-21 ENCOUNTER — Other Ambulatory Visit: Payer: Self-pay

## 2021-09-21 ENCOUNTER — Telehealth: Payer: Self-pay

## 2021-09-21 ENCOUNTER — Telehealth: Payer: Self-pay | Admitting: Physician Assistant

## 2021-09-21 ENCOUNTER — Ambulatory Visit (HOSPITAL_COMMUNITY)
Admission: RE | Admit: 2021-09-21 | Discharge: 2021-09-21 | Disposition: A | Discharge status: Home / Self Care | Payer: 59 | Source: Ambulatory Visit | Attending: Physician Assistant | Admitting: Physician Assistant

## 2021-09-21 DIAGNOSIS — Z8673 Personal history of transient ischemic attack (TIA), and cerebral infarction without residual deficits: Secondary | ICD-10-CM | POA: Insufficient documentation

## 2021-09-21 NOTE — Telephone Encounter (Signed)
Pt is returning a call to someone about results.  

## 2021-09-21 NOTE — Telephone Encounter (Signed)
I contacted patient, verbally understood

## 2021-09-21 NOTE — Progress Notes (Signed)
Lower extremity venous bilateral study completed.  Preliminary results relayed to [facility] for [place], [place].  See CV Proc for preliminary results report.   Jean Rosenthal, RDMS, RVT

## 2021-09-21 NOTE — Telephone Encounter (Signed)
Left message for patient to call back at 11:22am

## 2021-09-21 NOTE — Telephone Encounter (Signed)
Patient advise of no clot.

## 2021-09-21 NOTE — Telephone Encounter (Signed)
Received call from [facility] stating that patient was negative for any deep clot. She did have some superficial compressible areas on her leg but they stated patient was negative for blood clot I agreed to release patient at that time

## 2021-09-24 ENCOUNTER — Ambulatory Visit (HOSPITAL_COMMUNITY)
Admission: RE | Admit: 2021-09-24 | Discharge: 2021-09-24 | Disposition: A | Discharge status: Home / Self Care | Payer: 59 | Source: Ambulatory Visit | Attending: Physician Assistant | Admitting: Physician Assistant

## 2021-09-24 ENCOUNTER — Other Ambulatory Visit: Payer: Self-pay

## 2021-09-24 DIAGNOSIS — Z8673 Personal history of transient ischemic attack (TIA), and cerebral infarction without residual deficits: Secondary | ICD-10-CM | POA: Diagnosis present

## 2021-09-24 LAB — POCT I-STAT CREATININE: Creatinine, Ser: 0.7 mg/dL (ref 0.44–1.00)

## 2021-09-24 MED ORDER — IOHEXOL 350 MG/ML SOLN
80.0000 mL | Freq: Once | INTRAVENOUS | Status: AC | PRN
  Administered 2021-09-24: 75 mL via INTRAVENOUS

## 2021-09-24 MED ORDER — GADOBUTROL 1 MMOL/ML IV SOLN
8.0000 mL | Freq: Once | INTRAVENOUS | Status: AC | PRN
  Administered 2021-09-24: 8 mL via INTRAVENOUS

## 2021-10-25 ENCOUNTER — Other Ambulatory Visit: Payer: Self-pay

## 2021-10-25 ENCOUNTER — Ambulatory Visit (HOSPITAL_COMMUNITY)
Admission: RE | Admit: 2021-10-25 | Discharge: 2021-10-25 | Disposition: A | Discharge status: Home / Self Care | Payer: 59 | Source: Ambulatory Visit | Attending: Physician Assistant | Admitting: Physician Assistant

## 2021-10-25 DIAGNOSIS — I10 Essential (primary) hypertension: Secondary | ICD-10-CM

## 2021-10-25 DIAGNOSIS — Z8673 Personal history of transient ischemic attack (TIA), and cerebral infarction without residual deficits: Secondary | ICD-10-CM

## 2021-10-25 DIAGNOSIS — Q2112 Patent foramen ovale: Secondary | ICD-10-CM | POA: Diagnosis not present

## 2021-10-25 DIAGNOSIS — I071 Rheumatic tricuspid insufficiency: Secondary | ICD-10-CM | POA: Diagnosis not present

## 2021-10-25 LAB — ECHOCARDIOGRAM COMPLETE BUBBLE STUDY
Area-P 1/2: 4.49 cm2
Calc EF: 60.2 %
S' Lateral: 2.8 cm
Single Plane A2C EF: 59.1 %
Single Plane A4C EF: 59.3 %

## 2021-10-25 NOTE — Progress Notes (Signed)
°  Echocardiogram 2D Echocardiogram has been performed.  Augustine Radar 10/25/2021, 9:01 AM

## 2021-11-08 ENCOUNTER — Ambulatory Visit: Payer: 59

## 2021-12-05 ENCOUNTER — Other Ambulatory Visit: Payer: Self-pay

## 2021-12-05 ENCOUNTER — Ambulatory Visit (INDEPENDENT_AMBULATORY_CARE_PROVIDER_SITE_OTHER): Payer: 59

## 2021-12-05 DIAGNOSIS — Z1231 Encounter for screening mammogram for malignant neoplasm of breast: Secondary | ICD-10-CM | POA: Diagnosis not present

## 2021-12-19 ENCOUNTER — Encounter: Payer: Self-pay | Admitting: Physician Assistant

## 2021-12-19 ENCOUNTER — Other Ambulatory Visit: Payer: Self-pay

## 2021-12-19 ENCOUNTER — Ambulatory Visit (INDEPENDENT_AMBULATORY_CARE_PROVIDER_SITE_OTHER): Payer: 59 | Admitting: Physician Assistant

## 2021-12-19 VITALS — BP 115/77 | HR 79 | Resp 20 | Ht 62.0 in | Wt 160.0 lb

## 2021-12-19 DIAGNOSIS — Q2112 Patent foramen ovale: Secondary | ICD-10-CM

## 2021-12-19 DIAGNOSIS — Z8673 Personal history of transient ischemic attack (TIA), and cerebral infarction without residual deficits: Secondary | ICD-10-CM | POA: Diagnosis not present

## 2021-12-19 NOTE — Progress Notes (Signed)
NEUROLOGY FOLLOW UP OFFICE NOTE  Dekeisha Casablanca [id number]  Assessment/Plan:   TIA history of TIA July 04, 2021, with resolution, no recurrence.  Echo bubble study was positive with shunting observed within 3-6 cardiac cycles suggestive of interatrial shunt. There is a moderately sized patent foramen ovale with predominantly right to left shunting across the atrial septum.  Patient has been referred to cardiology by her PCP in view of this findings but  patient failed to follow-up, has been instructed to adhere to the plan due to risk of recurrence, history of bradycardia and palpitations.  Patient continues to be on daily baby aspirin. CT head, carotid ultrasound unremarkable.  Lipid panel normal, LDL 66. Lower extremity ultrasound negative for DVT. 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  CT angio of the chest was negative for PE, but aortic atherosclerosis was noted.  Recommend follow-up with Cardiology for PFO, bradycardia and palpitations by her PCP Follow-up on anemia with PCP Follow-up with [place] as needed   Case discussed with Dr. Tomi Likens, who agrees with the plan.  Subjective:   Patient returns today in follow-up after the pertinent work-up for TIA has been concluded.  She denies any new strokelike symptoms.  Denies any chest pain or palpitations at this time.  She is on aspirin daily.  She denies any vertigo, dizziness or vision changes, headache, dysarthria or dysphagia.  No further long distance trips.  No lower extremity pain.  She denies any chest pain or shortness of breath, or recent infections.  Patient is compliant with her other medications, she is active.  She sleeps better since starting using CPAP after a diagnosis in January of OSA.  Initial visit 09/18/21 Shirley Craig is a pleasant 56 y.o. [place] woman with a history of Vit D deficiency, history of bradycardia, hypertension  anemia referred by Almedia Balls, NP for continuation of care after having a TIA while in [place] on 07/04/2021, 24 hours after she had arrived to [place] in an 8-hour ride by plane.  During the plane ride, she experienced left lower extremity swelling, and pain at the ankle area, as well as chest tightness.  Very mild shortness of breath.  No dizziness or vertigo.  On presentation to the hospital, the patient had right arm weakness and numbness and tingling in her fingers, as well as numbness paralysis on the right side of her face.  She denies any trouble swallowing, or vision changes such as amaurosis fugax.  She denies any headaches.  No loss of consciousness.  The experience lasted about 2 minutes, with complete resolution of the symptoms.  At the hospital, CT of the head, 2D echo and carotid ultrasound, as well as EKG were performed, and with the exception of sinus bradycardia, the rest of the work-up was negative.  She was discharged on baby aspirin called Aspegic and upon return, this was changed to 81 mg ASA.  She denies any fever, chills night sweats, she denies any tobacco, or alcohol abuse.  She denies recreational drug intake.    Labs 09/12/2021: Iron 43, TIBC 356, iron saturation 12, Hemoglobin 11.5, hematocrit 34.3, MCV 85.9, platelets 423 CMP normal TSH 3.57 normal Vitamin D 2543, normal Lipid panel normal  PAST MEDICAL HISTORY: Past Medical History:  Diagnosis Date   History of bradycardia    History of palpitations    Hypertension    Hypothyroidism    Iron deficiency anemia    TIA (transient  ischemic attack)    Vitamin D deficiency     MEDICATIONS: Current Outpatient Medications on File Prior to Visit  Medication Sig Dispense Refill   aspirin EC 81 MG tablet Take 81 mg by mouth daily. Swallow whole.     candesartan (ATACAND) 8 MG tablet Take 8 mg by mouth daily.     FEROSUL 325 (65 Fe) MG tablet Take 325 mg by mouth daily.     rosuvastatin (CRESTOR) 10 MG tablet Take 10 mg  by mouth daily.     No current facility-administered medications on file prior to visit.    ALLERGIES: No Known Allergies  FAMILY HISTORY: Family History  Problem Relation Age of Onset   Hypertension Mother    Hypertension Father       Objective:   General: No acute distress.  Patient appears well groomed.   Head:  Normocephalic/atraumatic Eyes:  Fundi examined but not visualized Neck: supple, no paraspinal tenderness, full range of motion Heart:  Regular rate and rhythm, soft murmur at the left parasternal area Lungs:  Clear to auscultation bilaterally Back: No paraspinal tenderness Neurological Exam: alert and oriented to person, place, and time. Attention span and concentration intact, recent and remote memory intact, fund of knowledge intact.  Speech fluent and not dysarthric, language intact.  CN II-XII intact. Bulk and tone normal, muscle strength 5/5 throughout.  Sensation to light touch, temperature and vibration intact.  Deep tendon reflexes 2+ throughout, toes downgoing.  Finger to nose and heel to shin testing intact.  Gait normal, Romberg negative.     Metta Clines, DO  CC: Almedia Balls, NP

## 2021-12-19 NOTE — Patient Instructions (Addendum)
Follow up as needed  Continue Baby Aspirin daily Follow with Cardiology for PFO  bradycardia and palpitations Follow up on your anemia with your primary doctor

## 2021-12-31 ENCOUNTER — Ambulatory Visit (INDEPENDENT_AMBULATORY_CARE_PROVIDER_SITE_OTHER): Payer: 59 | Admitting: Cardiology

## 2021-12-31 ENCOUNTER — Ambulatory Visit (INDEPENDENT_AMBULATORY_CARE_PROVIDER_SITE_OTHER): Payer: 59

## 2021-12-31 ENCOUNTER — Encounter: Payer: Self-pay | Admitting: Cardiology

## 2021-12-31 ENCOUNTER — Other Ambulatory Visit: Payer: Self-pay

## 2021-12-31 VITALS — BP 114/74 | HR 73 | Ht 62.0 in | Wt 159.6 lb

## 2021-12-31 DIAGNOSIS — I10 Essential (primary) hypertension: Secondary | ICD-10-CM | POA: Diagnosis not present

## 2021-12-31 DIAGNOSIS — Z79899 Other long term (current) drug therapy: Secondary | ICD-10-CM

## 2021-12-31 DIAGNOSIS — R002 Palpitations: Secondary | ICD-10-CM | POA: Diagnosis not present

## 2021-12-31 DIAGNOSIS — Q2112 Patent foramen ovale: Secondary | ICD-10-CM

## 2021-12-31 DIAGNOSIS — I253 Aneurysm of heart: Secondary | ICD-10-CM | POA: Insufficient documentation

## 2021-12-31 DIAGNOSIS — Z8673 Personal history of transient ischemic attack (TIA), and cerebral infarction without residual deficits: Secondary | ICD-10-CM

## 2021-12-31 DIAGNOSIS — Z9989 Dependence on other enabling machines and devices: Secondary | ICD-10-CM

## 2021-12-31 DIAGNOSIS — G4733 Obstructive sleep apnea (adult) (pediatric): Secondary | ICD-10-CM

## 2021-12-31 LAB — CBC WITH DIFFERENTIAL/PLATELET
Basophils Absolute: 0.1 10*3/uL (ref 0.0–0.2)
Basos: 1 %
EOS (ABSOLUTE): 0.1 10*3/uL (ref 0.0–0.4)
Eos: 2 %
Hematocrit: 35.1 % (ref 34.0–46.6)
Hemoglobin: 11.5 g/dL (ref 11.1–15.9)
Immature Grans (Abs): 0 10*3/uL (ref 0.0–0.1)
Immature Granulocytes: 0 %
Lymphocytes Absolute: 1.6 10*3/uL (ref 0.7–3.1)
Lymphs: 28 %
MCH: 28.7 pg (ref 26.6–33.0)
MCHC: 32.8 g/dL (ref 31.5–35.7)
MCV: 88 fL (ref 79–97)
Monocytes Absolute: 0.5 10*3/uL (ref 0.1–0.9)
Monocytes: 10 %
Neutrophils Absolute: 3.2 10*3/uL (ref 1.4–7.0)
Neutrophils: 59 %
Platelets: 436 10*3/uL (ref 150–450)
RBC: 4.01 x10E6/uL (ref 3.77–5.28)
RDW: 13.5 % (ref 11.7–15.4)
WBC: 5.5 10*3/uL (ref 3.4–10.8)

## 2021-12-31 LAB — BASIC METABOLIC PANEL
BUN/Creatinine Ratio: 15 (ref 9–23)
BUN: 9 mg/dL (ref 6–24)
CO2: 26 mmol/L (ref 20–29)
Calcium: 9.2 mg/dL (ref 8.7–10.2)
Chloride: 104 mmol/L (ref 96–106)
Creatinine, Ser: 0.59 mg/dL (ref 0.57–1.00)
Glucose: 93 mg/dL (ref 70–99)
Potassium: 4.7 mmol/L (ref 3.5–5.2)
Sodium: 140 mmol/L (ref 134–144)
eGFR: 105 mL/min/{1.73_m2} (ref >59)

## 2021-12-31 LAB — MAGNESIUM: Magnesium: 2.2 mg/dL (ref 1.6–2.3)

## 2021-12-31 NOTE — Progress Notes (Signed)
Cardiology Office Note:    Date:  12/31/2021   ID:  Shirley Craig, DOB 1966/10/21, MRN [id number]  PCP:  Carilyn Goodpasture, NP  Cardiologist:  Thomasene Ripple, DO  Electrophysiologist:  None   Referring MD: Carilyn Goodpasture, NP   " I am am having palpitations"  History of Present Illness:    Shirley Craig is a 56 y.o. female with a hx of hypertension, hypothyroidism, iron deficiency anemia, OSA now on CPAP is here today to be evaluated for TIA and reported PFO.  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  During that time she noted that everything was good.  The next day when she was at home she started to feel some shortness of breath.  But then she noticed that her right arm started a few weeks she has some numbness and tingling.  For like the right side of her face was paralyzed.  She was taken to the hospital there she had testing done which are reported to be normal.  She was found to have sinus bradycardia.  She was started on baby aspirin as well as a statin.  After she came back to the [place] she was seen by her PCP and echo was repeated here showing a patent foramen ovale, she was also tested for sleep apnea and was found to have sleep apnea and started on CPAP.  She was then referred to cardiology.  She tells me today that she has been experiencing intermittent palpitations.  Was some lightheadedness.  No chest pain but does have intermittent shortness of breath.  No other complaints at this time.  She is here in office with her husband, but her visit was facilitated by in person interpreter Renae Fickle).     Past Medical History:  Diagnosis Date   History of bradycardia    History of palpitations    Hypertension    Hypothyroidism    Iron deficiency anemia    TIA (transient ischemic attack)    Vitamin D deficiency     No past surgical history on file.  Current Medications: Current Meds  Medication Sig    aspirin EC 81 MG tablet Take 81 mg by mouth daily. Swallow whole.   candesartan (ATACAND) 8 MG tablet Take 8 mg by mouth daily.   FEROSUL 325 (65 Fe) MG tablet Take 325 mg by mouth daily.   rosuvastatin (CRESTOR) 10 MG tablet Take 10 mg by mouth daily.     Allergies:   Patient has no known allergies.   Social History   Socioeconomic History   Marital status: Married    Spouse name: Not on file   Number of children: 3   Years of education: 12   Highest education level: Not on file  Occupational History   Not on file  Tobacco Use   Smoking status: Never   Smokeless tobacco: Never  Vaping Use   Vaping Use: Never used  Substance and Sexual Activity   Alcohol use: Never   Drug use: Never   Sexual activity: Not on file  Other Topics Concern   Not on file  Social History Narrative   Right handed   Drinks caffeine   One story home   Social Determinants of Health   Financial Resource Strain: Not on file  Food Insecurity: Not on file  Transportation Needs: Not on file  Physical Activity: Not on file  Stress: Not on file  Social Connections:  Not on file     Family History: The patient's family history includes Hypertension in her father and mother.  ROS:   Review of Systems  Constitution: Negative for decreased appetite, fever and weight gain.  HENT: Negative for congestion, ear discharge, hoarse voice and sore throat.   Eyes: Negative for discharge, redness, vision loss in right eye and visual halos.  Cardiovascular: Report palpitations.  Negative for chest pain, dyspnea on exertion, leg swelling, orthopnea. Respiratory: Negative for cough, hemoptysis, shortness of breath and snoring.   Endocrine: Negative for heat intolerance and polyphagia.  Hematologic/Lymphatic: Negative for bleeding problem. Does not bruise/bleed easily.  Skin: Negative for flushing, nail changes, rash and suspicious lesions.  Musculoskeletal: Negative for arthritis, joint pain, muscle cramps,  myalgias, neck pain and stiffness.  Gastrointestinal: Negative for abdominal pain, bowel incontinence, diarrhea and excessive appetite.  Genitourinary: Negative for decreased libido, genital sores and incomplete emptying.  Neurological: Negative for brief paralysis, focal weakness, headaches and loss of balance.  Psychiatric/Behavioral: Negative for altered mental status, depression and suicidal ideas.  Allergic/Immunologic: Negative for HIV exposure and persistent infections.    EKGs/Labs/Other Studies Reviewed:    The following studies were reviewed today:   EKG:  The ekg ordered today demonstrates sinus rhythm, heart rate 73 bpm with P wave morphology suggesting left atrial enlargement.  TTE 10/25/2021 IMPRESSIONS     1. Left ventricular ejection fraction, by estimation, is 60 to 65%. The  left ventricle has normal function. The left ventricle has no regional  wall motion abnormalities. Left ventricular diastolic parameters are  consistent with Grade I diastolic  dysfunction (impaired relaxation).   2. Right ventricular systolic function is normal. The right ventricular  size is normal. There is normal pulmonary artery systolic pressure.   3. The mitral valve is normal in structure. No evidence of mitral valve  regurgitation. No evidence of mitral stenosis.   4. The aortic valve is normal in structure. Aortic valve regurgitation is  not visualized. No aortic stenosis is present.   5. The inferior vena cava is normal in size with greater than 50%  respiratory variability, suggesting right atrial pressure of 3 mmHg.   6. Agitated saline contrast bubble study was positive with shunting  observed within 3-6 cardiac cycles suggestive of interatrial shunt. There  is a moderately sized patent foramen ovale with predominantly right to  left shunting across the atrial septum.   FINDINGS   Left Ventricle: Left ventricular ejection fraction, by estimation, is 60  to 65%. The left  ventricle has normal function. The left ventricle has no  regional wall motion abnormalities. The left ventricular internal cavity  size was normal in size. There is   no left ventricular hypertrophy. Left ventricular diastolic parameters  are consistent with Grade I diastolic dysfunction (impaired relaxation).   Right Ventricle: The right ventricular size is normal. No increase in  right ventricular wall thickness. Right ventricular systolic function is  normal. There is normal pulmonary artery systolic pressure. The tricuspid  regurgitant velocity is 2.15 m/s, and   with an assumed right atrial pressure of 3 mmHg, the estimated right  ventricular systolic pressure is 21.5 mmHg.   Left Atrium: Left atrial size was normal in size.   Right Atrium: Right atrial size was normal in size.   Pericardium: There is no evidence of pericardial effusion.   Mitral Valve: The mitral valve is normal in structure. No evidence of mitral valve regurgitation. No evidence of mitral valve stenosis.  Tricuspid Valve: The tricuspid valve is normal in structure. Tricuspid valve regurgitation is mild . No evidence of tricuspid stenosis.   Aortic Valve: The aortic valve is normal in structure. Aortic valve regurgitation is not visualized. No aortic stenosis is present.   Pulmonic Valve: The pulmonic valve was normal in structure. Pulmonic valve regurgitation is not visualized. No evidence of pulmonic stenosis.   Aorta: The aortic root is normal in size and structure.   Venous: The inferior vena cava is normal in size with greater than 50% respiratory variability, suggesting right atrial pressure of 3 mmHg.   IAS/Shunts: There is redundancy of the interatrial septum. No atrial level shunt detected by color flow Doppler. Agitated saline contrast was given  intravenously to evaluate for intracardiac shunting. Agitated saline contrast bubble study was positive  with shunting observed within 3-6 cardiac cycles  suggestive of interatrial shunt. A moderately sized patent foramen ovale is detected with  predominantly right to left shunting across the atrial septum.       Recent Labs: 09/24/2021: Creatinine, Ser 0.70  Recent Lipid Panel    Component Value Date/Time   CHOL 187 10/22/2010 2112   TRIG 96 10/22/2010 2112   HDL 44 10/22/2010 2112   CHOLHDL 4.3 Ratio 10/22/2010 2112   VLDL 19 10/22/2010 2112   LDLCALC 124 (H) 10/22/2010 2112    Physical Exam:    VS:  BP 114/74    Pulse 73    Ht 5\' 2"  (1.575 m)    Wt 159 lb 9.6 oz (72.4 kg)    LMP 11/25/2018    SpO2 96%    BMI 29.19 kg/m     Wt Readings from Last 3 Encounters:  12/31/21 159 lb 9.6 oz (72.4 kg)  12/19/21 160 lb (72.6 kg)  09/18/21 159 lb (72.1 kg)     GEN: Well nourished, well developed in no acute distress HEENT: Normal NECK: No JVD; No carotid bruits LYMPHATICS: No lymphadenopathy CARDIAC: S1S2 noted,RRR, no murmurs, rubs, gallops RESPIRATORY:  Clear to auscultation without rales, wheezing or rhonchi  ABDOMEN: Soft, non-tender, non-distended, +bowel sounds, no guarding. EXTREMITIES: No edema, No cyanosis, no clubbing MUSCULOSKELETAL:  No deformity  SKIN: Warm and dry NEUROLOGIC:  Alert and oriented x 3, non-focal PSYCHIATRIC:  Normal affect, good insight  ASSESSMENT:    1. PFO (patent foramen ovale)   2. Palpitations   3. History of TIA (transient ischemic attack)   4. Primary hypertension   5. OSA on CPAP   6. Medication management    PLAN:     I would like to rule out a cardiovascular etiology of this palpitation, therefore at this time I would like to placed a zio patch for 14  days.   Her echocardiogram did show evidence of a patent foramen ovale.  With a history of TIA it will be best to get a TEE to be able to get a good understanding of her PFO.  Shared Decision Making/Informed Consent The risks [esophageal damage, perforation (1:10,000 risk), bleeding, pharyngeal hematoma as well as other potential  complications associated with conscious sedation including aspiration, arrhythmia, respiratory failure and death], benefits (treatment guidance and diagnostic support) and alternatives of a transesophageal echocardiogram were discussed in detail with Ms. Fukuda and she is willing to proceed.    Blood pressure is acceptable, continue with current antihypertensive regimen.  Hyperlipidemia - continue with current statin medication.  Continue with CPAP  The patient is in agreement with the above plan. The patient left the office  in stable condition.  The patient will follow up in   Medication Adjustments/Labs and Tests Ordered: Current medicines are reviewed at length with the patient today.  Concerns regarding medicines are outlined above.  Orders Placed This Encounter  Procedures   Basic Metabolic Panel (BMET)   Magnesium   CBC with Differential/Platelet   LONG TERM MONITOR (3-14 DAYS)   EKG 12-Lead   No orders of the defined types were placed in this encounter.   Patient Instructions  Medication Instructions:  Your physician recommends that you continue on your current medications as directed. Please refer to the Current Medication list given to you today.  *If you need a refill on your cardiac medications before your next appointment, please call your pharmacy*   Lab Work: Your physician recommends that you return for lab work in:  TODAY: BMET, Mag, CBC  Testing/Procedures: ZIO XT- Long Term Monitor Instructions  Your physician has requested you wear a ZIO patch monitor for 14 days.  This is a single patch monitor. [employer] supplies one patch monitor per enrollment. Additional stickers are not available. Please do not apply patch if you will be having a Nuclear Stress Test,  Echocardiogram, Cardiac CT, MRI, or Chest Xray during the period you would be wearing the  monitor. The patch cannot be worn during these tests. You cannot remove and re-apply the  ZIO XT patch  monitor.  Your ZIO patch monitor will be mailed 3 day USPS to your address on file. It may take 3-5 days  to receive your monitor after you have been enrolled.  Once you have received your monitor, please review the enclosed instructions. Your monitor  has already been registered assigning a specific monitor serial # to you.  Billing and Patient Assistance Program Information  We have supplied [employer] with any of your insurance information on file for billing purposes. [employer] offers a sliding scale Patient Assistance Program for patients that do not have  insurance, or whose insurance does not completely cover the cost of the ZIO monitor.  You must apply for the Patient Assistance Program to qualify for this discounted rate.  To apply, please call [employer] at [phone], select option 4, select option 2, ask to apply for  Patient Assistance Program. Meredeth [employer] will ask your household income, and how many people  are in your household. They will quote your out-of-pocket cost based on that information.  [employer] will also be able to set up a 1623-month, interest-free payment plan if needed.  Applying the monitor   Shave hair from upper left chest.  Hold abrader disc by orange tab. Rub abrader in 40 strokes over the upper left chest as  indicated in your monitor instructions.  Clean area with 4 enclosed alcohol pads. Let dry.  Apply patch as indicated in monitor instructions. Patch will be placed under collarbone on left  side of chest with arrow pointing upward.  Rub patch adhesive wings for 2 minutes. Remove white label marked "1". Remove the white  label marked "2". Rub patch adhesive wings for 2 additional minutes.  While looking in a mirror, press and release button in center of patch. A small green light will  flash 3-4 times. This will be your only indicator that the monitor has been turned on.  Do not shower for the first 24 hours. You may shower after the first 24 hours.  Press the  button if you feel a symptom. You will hear a small click. Record Date, Time and  Symptom in the Patient Logbook.  When you are ready to remove the patch, follow instructions on the last 2 pages of Patient  Logbook. Stick patch monitor onto the last page of Patient Logbook.  Place Patient Logbook in the blue and white box. Use locking tab on box and tape box closed  securely. The blue and white box has prepaid postage on it. Please place it in the mailbox as  soon as possible. Your physician should have your test results approximately 7 days after the  monitor has been mailed back to [facility].  Call [facility] Customer Care at [phone] if you have questions regarding  your ZIO XT patch monitor. Call them immediately if you see an orange light blinking on your  monitor.  If your monitor falls off in less than 4 days, contact our Monitor department at [phone].  If your monitor becomes loose or falls off after 4 days call [employer] at [phone] for  suggestions on securing your monitor  If you have labs (blood work) drawn today and your tests are completely normal, you will receive your results only by: MyChart Message (if you have MyChart) OR A paper copy in the mail If you have any lab test that is abnormal or we need to change your treatment, we will call you to review the results.  You are scheduled for a TEE on March 1st with Dr. Eden Emms.  Please arrive at the [facility] (Main Entrance A) at [facility]: [address] [place], [place] [address] at 11 am. (1 hour prior to procedure unless lab work is needed; if lab work is needed arrive 1.5 hours ahead)  DIET: Nothing to eat or drink after midnight except a sip of water with medications (see medication instructions below)  FYI: For your safety, and to allow [place] to monitor your vital signs accurately during the surgery/procedure we request that   if you have artificial nails, gel coating, SNS etc. Please have  those removed prior to your surgery/procedure. Not having the nail coverings /polish removed may result in cancellation or delay of your surgery/procedure.  You must have a responsible person to drive you home and stay in the waiting area during your procedure. Failure to do so could result in cancellation.  Bring your insurance cards.  *Special Note: Every effort is made to have your procedure done on time. Occasionally there are emergencies that occur at the hospital that may cause delays. Please be patient if a delay does occur.    Follow-Up:  Your next appointment:   4 month(s)  The format for your next appointment:   In Person  Provider:   Thomasene Ripple, DO     Other Instructions     Adopting a Healthy Lifestyle.  Know what a healthy weight is for you (roughly BMI <25) and aim to maintain this   Aim for 7+ servings of fruits and vegetables daily   65-80+ fluid ounces of water or unsweet tea for healthy kidneys   Limit to max 1 drink of alcohol per day; avoid smoking/tobacco   Limit animal fats in diet for cholesterol and heart health - choose grass fed whenever available   Avoid highly processed foods, and foods high in saturated/trans fats   Aim for low stress - take time to unwind and care for your mental health   Aim for 150 min of moderate intensity exercise weekly for heart health, and weights twice weekly for bone health   Aim for  7-9 hours of sleep daily   When it comes to diets, agreement about the perfect plan isnt easy to find, even among the experts. Experts at the [facility] of [employer] developed an idea known as the Healthy Eating Plate. Just imagine a plate divided into logical, healthy portions.   The emphasis is on diet quality:   Load up on vegetables and fruits - one-half of your plate: Aim for color and variety, and remember that potatoes dont count.   Go for whole grains - one-quarter of your plate: Whole wheat, barley, wheat  berries, quinoa, oats, brown rice, and foods made with them. If you want pasta, go with whole wheat pasta.   Protein power - one-quarter of your plate: Fish, chicken, beans, and nuts are all healthy, versatile protein sources. Limit red meat.   The diet, however, does go beyond the plate, offering a few other suggestions.   Use healthy plant oils, such as olive, canola, soy, corn, sunflower and peanut. Check the labels, and avoid partially hydrogenated oil, which have unhealthy trans fats.   If youre thirsty, drink water. Coffee and tea are good in moderation, but skip sugary drinks and limit milk and dairy products to one or two daily servings.   The type of carbohydrate in the diet is more important than the amount. Some sources of carbohydrates, such as vegetables, fruits, whole grains, and beans-are healthier than others.   Finally, stay active  Signed, Thomasene Ripple, DO  12/31/2021 9:18 AM    [facility]

## 2021-12-31 NOTE — Patient Instructions (Addendum)
Medication Instructions:  Your physician recommends that you continue on your current medications as directed. Please refer to the Current Medication list given to you today.  *If you need a refill on your cardiac medications before your next appointment, please call your pharmacy*   Lab Work: Your physician recommends that you return for lab work in:  TODAY: BMET, Mag, CBC  Testing/Procedures: ZIO XT- Long Term Monitor Instructions  Your physician has requested you wear a ZIO patch monitor for 14 days.  This is a single patch monitor. [employer] supplies one patch monitor per enrollment. Additional stickers are not available. Please do not apply patch if you will be having a Nuclear Stress Test,  Echocardiogram, Cardiac CT, MRI, or Chest Xray during the period you would be wearing the  monitor. The patch cannot be worn during these tests. You cannot remove and re-apply the  ZIO XT patch monitor.  Your ZIO patch monitor will be mailed 3 day USPS to your address on file. It may take 3-5 days  to receive your monitor after you have been enrolled.  Once you have received your monitor, please review the enclosed instructions. Your monitor  has already been registered assigning a specific monitor serial # to you.  Billing and Patient Assistance Program Information  We have supplied [employer] with any of your insurance information on file for billing purposes. [employer] offers a sliding scale Patient Assistance Program for patients that do not have  insurance, or whose insurance does not completely cover the cost of the ZIO monitor.  You must apply for the Patient Assistance Program to qualify for this discounted rate.  To apply, please call [employer] at [phone], select option 4, select option 2, ask to apply for  Patient Assistance Program. Meredeth Ide will ask your household income, and how many people  are in your household. They will quote your out-of-pocket cost based on that information.   [employer] will also be able to set up a 51-month, interest-free payment plan if needed.  Applying the monitor   Shave hair from upper left chest.  Hold abrader disc by orange tab. Rub abrader in 40 strokes over the upper left chest as  indicated in your monitor instructions.  Clean area with 4 enclosed alcohol pads. Let dry.  Apply patch as indicated in monitor instructions. Patch will be placed under collarbone on left  side of chest with arrow pointing upward.  Rub patch adhesive wings for 2 minutes. Remove white label marked "1". Remove the white  label marked "2". Rub patch adhesive wings for 2 additional minutes.  While looking in a mirror, press and release button in center of patch. A small green light will  flash 3-4 times. This will be your only indicator that the monitor has been turned on.  Do not shower for the first 24 hours. You may shower after the first 24 hours.  Press the button if you feel a symptom. You will hear a small click. Record Date, Time and  Symptom in the Patient Logbook.  When you are ready to remove the patch, follow instructions on the last 2 pages of Patient  Logbook. Stick patch monitor onto the last page of Patient Logbook.  Place Patient Logbook in the blue and white box. Use locking tab on box and tape box closed  securely. The blue and white box has prepaid postage on it. Please place it in the mailbox as  soon as possible. Your physician should have your test results approximately  7 days after the  monitor has been mailed back to [place].  Call [facility] Customer Care at [phone] if you have questions regarding  your ZIO XT patch monitor. Call them immediately if you see an orange light blinking on your  monitor.  If your monitor falls off in less than 4 days, contact our Monitor department at [phone].  If your monitor becomes loose or falls off after 4 days call [employer] at [phone] for  suggestions on securing your  monitor  If you have labs (blood work) drawn today and your tests are completely normal, you will receive your results only by: MyChart Message (if you have MyChart) OR A paper copy in the mail If you have any lab test that is abnormal or we need to change your treatment, we will call you to review the results.  You are scheduled for a TEE on March 1st with Dr. Eden Emms.  Please arrive at the [facility] (Main Entrance A) at [facility]: [address] [place], [place] [address] at 11 am. (1 hour prior to procedure unless lab work is needed; if lab work is needed arrive 1.5 hours ahead)  DIET: Nothing to eat or drink after midnight except a sip of water with medications (see medication instructions below)  FYI: For your safety, and to allow [place] to monitor your vital signs accurately during the surgery/procedure we request that   if you have artificial nails, gel coating, SNS etc. Please have those removed prior to your surgery/procedure. Not having the nail coverings /polish removed may result in cancellation or delay of your surgery/procedure.  You must have a responsible person to drive you home and stay in the waiting area during your procedure. Failure to do so could result in cancellation.  Bring your insurance cards.  *Special Note: Every effort is made to have your procedure done on time. Occasionally there are emergencies that occur at the hospital that may cause delays. Please be patient if a delay does occur.    Follow-Up:  Your next appointment:   4 month(s)  The format for your next appointment:   In Person  Provider:   Thomasene Ripple, DO     Other Instructions   Case number: [phone]

## 2021-12-31 NOTE — H&P (View-Only) (Signed)
°Cardiology Office Note:   ° °Date:  12/31/2021  ° °ID:  Shirley Craig, DOB 02/04/1966, MRN [id number] ° °PCP:  Hester, Brooke, NP  °Cardiologist:  Natahlia Hoggard, DO  °Electrophysiologist:  None  ° °Referring MD: Hester, Brooke, NP  ° °" I am am having palpitations" ° °History of Present Illness:   ° °Shirley Craig is a 55 y.o. female with a hx of hypertension, hypothyroidism, iron deficiency anemia, OSA now on CPAP is here today to be evaluated for TIA and reported PFO. ° °The patient tells me he has started back in August when she traveled to [place] which was a 8-hour flight.  During that time she noted that everything was good.  The next day when she was at home she started to feel some shortness of breath.  But then she noticed that her right arm started a few weeks she has some numbness and tingling.  For like the right side of her face was paralyzed.  She was taken to the hospital there she had testing done which are reported to be normal.  She was found to have sinus bradycardia.  She was started on baby aspirin as well as a statin. ° °After she came back to the [place] she was seen by her PCP and echo was repeated here showing a patent foramen ovale, she was also tested for sleep apnea and was found to have sleep apnea and started on CPAP.  She was then referred to cardiology. ° °She tells me today that she has been experiencing intermittent palpitations.  Was some lightheadedness.  No chest pain but does have intermittent shortness of breath.  No other complaints at this time. ° °She is here in office with her husband, but her visit was facilitated by in person interpreter (Maia).  ° ° ° °Past Medical History:  °Diagnosis Date  ° History of bradycardia   ° History of palpitations   ° Hypertension   ° Hypothyroidism   ° Iron deficiency anemia   ° TIA (transient ischemic attack)   ° Vitamin D deficiency   ° ° °No past surgical history on file. ° °Current Medications: °Current Meds  °Medication Sig  °  aspirin EC 81 MG tablet Take 81 mg by mouth daily. Swallow whole.  ° candesartan (ATACAND) 8 MG tablet Take 8 mg by mouth daily.  ° FEROSUL 325 (65 Fe) MG tablet Take 325 mg by mouth daily.  ° rosuvastatin (CRESTOR) 10 MG tablet Take 10 mg by mouth daily.  °  ° °Allergies:   Patient has no known allergies.  ° °Social History  ° °Socioeconomic History  ° Marital status: Married  °  Spouse name: Not on file  ° Number of children: 3  ° Years of education: 12  ° Highest education level: Not on file  °Occupational History  ° Not on file  °Tobacco Use  ° Smoking status: Never  ° Smokeless tobacco: Never  °Vaping Use  ° Vaping Use: Never used  °Substance and Sexual Activity  ° Alcohol use: Never  ° Drug use: Never  ° Sexual activity: Not on file  °Other Topics Concern  ° Not on file  °Social History Narrative  ° Right handed  ° Drinks caffeine  ° One story home  ° °Social Determinants of Health  ° °Financial Resource Strain: Not on file  °Food Insecurity: Not on file  °Transportation Needs: Not on file  °Physical Activity: Not on file  °Stress: Not on file  °Social Connections:   Not on file  °  ° °Family History: °The patient's family history includes Hypertension in her father and mother. ° °ROS:   °Review of Systems  °Constitution: Negative for decreased appetite, fever and weight gain.  °HENT: Negative for congestion, ear discharge, hoarse voice and sore throat.   °Eyes: Negative for discharge, redness, vision loss in right eye and visual halos.  °Cardiovascular: Report palpitations.  Negative for chest pain, dyspnea on exertion, leg swelling, orthopnea. °Respiratory: Negative for cough, hemoptysis, shortness of breath and snoring.   °Endocrine: Negative for heat intolerance and polyphagia.  °Hematologic/Lymphatic: Negative for bleeding problem. Does not bruise/bleed easily.  °Skin: Negative for flushing, nail changes, rash and suspicious lesions.  °Musculoskeletal: Negative for arthritis, joint pain, muscle cramps,  myalgias, neck pain and stiffness.  °Gastrointestinal: Negative for abdominal pain, bowel incontinence, diarrhea and excessive appetite.  °Genitourinary: Negative for decreased libido, genital sores and incomplete emptying.  °Neurological: Negative for brief paralysis, focal weakness, headaches and loss of balance.  °Psychiatric/Behavioral: Negative for altered mental status, depression and suicidal ideas.  °Allergic/Immunologic: Negative for HIV exposure and persistent infections.  ° ° °EKGs/Labs/Other Studies Reviewed:   ° °The following studies were reviewed today: ° ° °EKG:  The ekg ordered today demonstrates sinus rhythm, heart rate 73 bpm with P wave morphology suggesting left atrial enlargement. ° °TTE 10/25/2021 °IMPRESSIONS  ° ° ° 1. Left ventricular ejection fraction, by estimation, is 60 to 65%. The  °left ventricle has normal function. The left ventricle has no regional  °wall motion abnormalities. Left ventricular diastolic parameters are  °consistent with Grade I diastolic  °dysfunction (impaired relaxation).  ° 2. Right ventricular systolic function is normal. The right ventricular  °size is normal. There is normal pulmonary artery systolic pressure.  ° 3. The mitral valve is normal in structure. No evidence of mitral valve  °regurgitation. No evidence of mitral stenosis.  ° 4. The aortic valve is normal in structure. Aortic valve regurgitation is  °not visualized. No aortic stenosis is present.  ° 5. The inferior vena cava is normal in size with greater than 50%  °respiratory variability, suggesting right atrial pressure of 3 mmHg.  ° 6. Agitated saline contrast bubble study was positive with shunting  °observed within 3-6 cardiac cycles suggestive of interatrial shunt. There  °is a moderately sized patent foramen ovale with predominantly right to  °left shunting across the atrial septum.  ° °FINDINGS  ° Left Ventricle: Left ventricular ejection fraction, by estimation, is 60  °to 65%. The left  ventricle has normal function. The left ventricle has no  °regional wall motion abnormalities. The left ventricular internal cavity  °size was normal in size. There is  ° no left ventricular hypertrophy. Left ventricular diastolic parameters  °are consistent with Grade I diastolic dysfunction (impaired relaxation).  ° °Right Ventricle: The right ventricular size is normal. No increase in  °right ventricular wall thickness. Right ventricular systolic function is  °normal. There is normal pulmonary artery systolic pressure. The tricuspid  °regurgitant velocity is 2.15 m/s, and  ° with an assumed right atrial pressure of 3 mmHg, the estimated right  °ventricular systolic pressure is 21.5 mmHg.  ° °Left Atrium: Left atrial size was normal in size.  ° °Right Atrium: Right atrial size was normal in size.  ° °Pericardium: There is no evidence of pericardial effusion.  ° °Mitral Valve: The mitral valve is normal in structure. No evidence of mitral valve regurgitation. No evidence of mitral valve stenosis.  ° °  Tricuspid Valve: The tricuspid valve is normal in structure. Tricuspid valve regurgitation is mild . No evidence of tricuspid stenosis.  ° °Aortic Valve: The aortic valve is normal in structure. Aortic valve regurgitation is not visualized. No aortic stenosis is present.  ° °Pulmonic Valve: The pulmonic valve was normal in structure. Pulmonic valve regurgitation is not visualized. No evidence of pulmonic stenosis.  ° °Aorta: The aortic root is normal in size and structure.  ° °Venous: The inferior vena cava is normal in size with greater than 50% respiratory variability, suggesting right atrial pressure of 3 mmHg.  ° °IAS/Shunts: There is redundancy of the interatrial septum. No atrial level shunt detected by color flow Doppler. Agitated saline contrast was given  °intravenously to evaluate for intracardiac shunting. Agitated saline contrast bubble study was positive  °with shunting observed within 3-6 cardiac cycles  suggestive of interatrial shunt. A moderately sized patent foramen ovale is detected with  °predominantly right to left shunting across the atrial septum.  ° °   ° °Recent Labs: °09/24/2021: Creatinine, Ser 0.70  °Recent Lipid Panel °   °Component Value Date/Time  ° CHOL 187 10/22/2010 2112  ° TRIG 96 10/22/2010 2112  ° HDL 44 10/22/2010 2112  ° CHOLHDL 4.3 Ratio 10/22/2010 2112  ° VLDL 19 10/22/2010 2112  ° LDLCALC 124 (H) 10/22/2010 2112  ° ° °Physical Exam:   ° °VS:  BP 114/74    Pulse 73    Ht 5' 2" (1.575 m)    Wt 159 lb 9.6 oz (72.4 kg)    LMP 11/25/2018    SpO2 96%    BMI 29.19 kg/m²    ° °Wt Readings from Last 3 Encounters:  °12/31/21 159 lb 9.6 oz (72.4 kg)  °12/19/21 160 lb (72.6 kg)  °09/18/21 159 lb (72.1 kg)  °  ° °GEN: Well nourished, well developed in no acute distress °HEENT: Normal °NECK: No JVD; No carotid bruits °LYMPHATICS: No lymphadenopathy °CARDIAC: S1S2 noted,RRR, no murmurs, rubs, gallops °RESPIRATORY:  Clear to auscultation without rales, wheezing or rhonchi  °ABDOMEN: Soft, non-tender, non-distended, +bowel sounds, no guarding. °EXTREMITIES: No edema, No cyanosis, no clubbing °MUSCULOSKELETAL:  No deformity  °SKIN: Warm and dry °NEUROLOGIC:  Alert and oriented x 3, non-focal °PSYCHIATRIC:  Normal affect, good insight ° °ASSESSMENT:   ° °1. PFO (patent foramen ovale)   °2. Palpitations   °3. History of TIA (transient ischemic attack)   °4. Primary hypertension   °5. OSA on CPAP   °6. Medication management   ° °PLAN:   ° ° °I would like to rule out a cardiovascular etiology of this palpitation, therefore at this time I would like to placed a zio patch for 14  days.  ° °Her echocardiogram did show evidence of a patent foramen ovale.  With a history of TIA it will be best to get a TEE to be able to get a good understanding of her PFO. ° °Shared Decision Making/Informed Consent °The risks [esophageal damage, perforation (1:10,000 risk), bleeding, pharyngeal hematoma as well as other potential  complications associated with conscious sedation including aspiration, arrhythmia, respiratory failure and death], benefits (treatment guidance and diagnostic support) and alternatives of a transesophageal echocardiogram were discussed in detail with Ms. Montz and she is willing to proceed.   ° °Blood pressure is acceptable, continue with current antihypertensive regimen. ° °Hyperlipidemia - continue with current statin medication. ° °Continue with CPAP ° °The patient is in agreement with the above plan. The patient left the office   in stable condition.  The patient will follow up in ° ° °Medication Adjustments/Labs and Tests Ordered: °Current medicines are reviewed at length with the patient today.  Concerns regarding medicines are outlined above.  °Orders Placed This Encounter  °Procedures  ° Basic Metabolic Panel (BMET)  ° Magnesium  ° CBC with Differential/Platelet  ° LONG TERM MONITOR (3-14 DAYS)  ° EKG 12-Lead  ° °No orders of the defined types were placed in this encounter. ° ° °Patient Instructions  °Medication Instructions:  °Your physician recommends that you continue on your current medications as directed. Please refer to the Current Medication list given to you today. ° °*If you need a refill on your cardiac medications before your next appointment, please call your pharmacy* ° ° °Lab Work: °Your physician recommends that you return for lab work in:  °TODAY: BMET, Mag, CBC ° °Testing/Procedures: °ZIO XT- Long Term Monitor Instructions ° °Your physician has requested you wear a ZIO patch monitor for 14 days.  °This is a single patch monitor. [employer] supplies one patch monitor per enrollment. Additional °stickers are not available. Please do not apply patch if you will be having a Nuclear Stress Test,  °Echocardiogram, Cardiac CT, MRI, or Chest Xray during the period you would be wearing the  °monitor. The patch cannot be worn during these tests. You cannot remove and re-apply the  °ZIO XT patch  monitor.  °Your ZIO patch monitor will be mailed 3 day USPS to your address on file. It may take 3-5 days  °to receive your monitor after you have been enrolled.  °Once you have received your monitor, please review the enclosed instructions. Your monitor  °has already been registered assigning a specific monitor serial # to you. ° °Billing and Patient Assistance Program Information ° °We have supplied [employer] with any of your insurance information on file for billing purposes. °[employer] offers a sliding scale Patient Assistance Program for patients that do not have  °insurance, or whose insurance does not completely cover the cost of the ZIO monitor.  °You must apply for the Patient Assistance Program to qualify for this discounted rate.  °To apply, please call [employer] at [phone], select option 4, select option 2, ask to apply for  °Patient Assistance Program. [employer] will ask your household income, and how many people  °are in your household. They will quote your out-of-pocket cost based on that information.  °[employer] will also be able to set up a 12-month, interest-free payment plan if needed. ° °Applying the monitor °  °Shave hair from upper left chest.  °Hold abrader disc by orange tab. Rub abrader in 40 strokes over the upper left chest as  °indicated in your monitor instructions.  °Clean area with 4 enclosed alcohol pads. Let dry.  °Apply patch as indicated in monitor instructions. Patch will be placed under collarbone on left  °side of chest with arrow pointing upward.  °Rub patch adhesive wings for 2 minutes. Remove white label marked "1". Remove the white  °label marked "2". Rub patch adhesive wings for 2 additional minutes.  °While looking in a mirror, press and release button in center of patch. A small green light will  °flash 3-4 times. This will be your only indicator that the monitor has been turned on.  °Do not shower for the first 24 hours. You may shower after the first 24 hours.  °Press the  button if you feel a symptom. You will hear a small click. Record Date, Time and  °  Symptom in the Patient Logbook.  °When you are ready to remove the patch, follow instructions on the last 2 pages of Patient  °Logbook. Stick patch monitor onto the last page of Patient Logbook.  °Place Patient Logbook in the blue and white box. Use locking tab on box and tape box closed  °securely. The blue and white box has prepaid postage on it. Please place it in the mailbox as  °soon as possible. Your physician should have your test results approximately 7 days after the  °monitor has been mailed back to [employer].  °Call [employer] Customer Care at [phone] if you have questions regarding  °your ZIO XT patch monitor. Call them immediately if you see an orange light blinking on your  °monitor.  °If your monitor falls off in less than 4 days, contact our Monitor department at [phone].  °If your monitor becomes loose or falls off after 4 days call [employer] at [phone] for  °suggestions on securing your monitor ° °If you have labs (blood work) drawn today and your tests are completely normal, you will receive your results only by: °MyChart Message (if you have MyChart) OR °A paper copy in the mail °If you have any lab test that is abnormal or we need to change your treatment, we will call you to review the results. ° °You are scheduled for a TEE on March 1st with Dr. Nishan.  Please arrive at the [facility] (Main Entrance A) at [facility]: [address] [place], [place] [address] at 11 am. (1 hour prior to procedure unless lab work is needed; if lab work is needed arrive 1.5 hours ahead) ° °DIET: Nothing to eat or drink after midnight except a sip of water with medications (see medication instructions below) ° °FYI: For your safety, and to allow us to monitor your vital signs accurately during the surgery/procedure we request that   °if you have artificial nails, gel coating, SNS etc. Please have  those removed prior to your surgery/procedure. Not having the nail coverings /polish removed may result in cancellation or delay of your surgery/procedure. ° °You must have a responsible person to drive you home and stay in the waiting area during your procedure. Failure to do so could result in cancellation. ° °Bring your insurance cards. ° °*Special Note: Every effort is made to have your procedure done on time. Occasionally there are emergencies that occur at the hospital that may cause delays. Please be patient if a delay does occur.  ° ° °Follow-Up: ° °Your next appointment:   °4 month(s) ° °The format for your next appointment:   °In Person ° °Provider:   °Azusena Erlandson, DO   ° ° °Other Instructions °   ° °Adopting a Healthy Lifestyle. ° °Know what a healthy weight is for you (roughly BMI <25) and aim to maintain this °  °Aim for 7+ servings of fruits and vegetables daily °  °65-80+ fluid ounces of water or unsweet tea for healthy kidneys °  °Limit to max 1 drink of alcohol per day; avoid smoking/tobacco °  °Limit animal fats in diet for cholesterol and heart health - choose grass fed whenever available °  °Avoid highly processed foods, and foods high in saturated/trans fats °  °Aim for low stress - take time to unwind and care for your mental health °  °Aim for 150 min of moderate intensity exercise weekly for heart health, and weights twice weekly for bone health °  °Aim for   7-9 hours of sleep daily °  °When it comes to diets, agreement about the perfect plan isn´t easy to find, even among the experts. °Experts at the [employer] developed an idea known as the Healthy Eating Plate. Just imagine a plate divided into logical, healthy portions. °  °The emphasis is on diet quality: °  °Load up on vegetables and fruits - one-half of your plate: Aim for color and variety, and remember that potatoes don´t count. °  °Go for whole grains - one-quarter of your plate: Whole wheat, barley, wheat  berries, quinoa, oats, brown rice, and foods made with them. If you want pasta, go with whole wheat pasta. °  °Protein power - one-quarter of your plate: Fish, chicken, beans, and nuts are all healthy, versatile protein sources. Limit red meat. °  °The diet, however, does go beyond the plate, offering a few other suggestions. °  °Use healthy plant oils, such as olive, canola, soy, corn, sunflower and peanut. Check the labels, and avoid partially hydrogenated oil, which have unhealthy trans fats. °  °If you´re thirsty, drink water. Coffee and tea are good in moderation, but skip sugary drinks and limit milk and dairy products to one or two daily servings. °  °The type of carbohydrate in the diet is more important than the amount. Some sources of carbohydrates, such as vegetables, fruits, whole grains, and beans-are healthier than others. °  °Finally, stay active ° °Signed, °Harbert Fitterer, DO  °12/31/2021 9:18 AM    °[facility] °

## 2021-12-31 NOTE — Progress Notes (Unsigned)
Enrolled for [employer] to mail a ZIO XT long term holter monitor to the patients address on file.  

## 2022-01-02 ENCOUNTER — Ambulatory Visit (HOSPITAL_COMMUNITY)
Admission: RE | Admit: 2022-01-02 | Discharge: 2022-01-02 | Disposition: A | Discharge status: Home / Self Care | Payer: 59 | Attending: Cardiovascular Disease | Admitting: Cardiovascular Disease

## 2022-01-02 ENCOUNTER — Other Ambulatory Visit (HOSPITAL_COMMUNITY): Payer: Self-pay | Admitting: Cardiovascular Disease

## 2022-01-02 ENCOUNTER — Ambulatory Visit (HOSPITAL_BASED_OUTPATIENT_CLINIC_OR_DEPARTMENT_OTHER): Payer: 59

## 2022-01-02 ENCOUNTER — Ambulatory Visit (HOSPITAL_COMMUNITY): Payer: 59 | Admitting: Anesthesiology

## 2022-01-02 ENCOUNTER — Encounter (HOSPITAL_COMMUNITY): Payer: Self-pay | Admitting: Cardiovascular Disease

## 2022-01-02 ENCOUNTER — Other Ambulatory Visit: Payer: Self-pay

## 2022-01-02 ENCOUNTER — Ambulatory Visit (HOSPITAL_BASED_OUTPATIENT_CLINIC_OR_DEPARTMENT_OTHER): Payer: 59 | Admitting: Anesthesiology

## 2022-01-02 ENCOUNTER — Encounter (HOSPITAL_COMMUNITY)
Admission: RE | Disposition: A | Discharge status: Home / Self Care | Payer: 59 | Source: Home / Self Care | Attending: Cardiovascular Disease

## 2022-01-02 DIAGNOSIS — E039 Hypothyroidism, unspecified: Secondary | ICD-10-CM | POA: Insufficient documentation

## 2022-01-02 DIAGNOSIS — Q2112 Patent foramen ovale: Secondary | ICD-10-CM | POA: Insufficient documentation

## 2022-01-02 DIAGNOSIS — D509 Iron deficiency anemia, unspecified: Secondary | ICD-10-CM | POA: Insufficient documentation

## 2022-01-02 DIAGNOSIS — I10 Essential (primary) hypertension: Secondary | ICD-10-CM | POA: Insufficient documentation

## 2022-01-02 DIAGNOSIS — G4733 Obstructive sleep apnea (adult) (pediatric): Secondary | ICD-10-CM | POA: Diagnosis not present

## 2022-01-02 DIAGNOSIS — Z8673 Personal history of transient ischemic attack (TIA), and cerebral infarction without residual deficits: Secondary | ICD-10-CM | POA: Diagnosis not present

## 2022-01-02 DIAGNOSIS — E785 Hyperlipidemia, unspecified: Secondary | ICD-10-CM | POA: Diagnosis not present

## 2022-01-02 DIAGNOSIS — G4730 Sleep apnea, unspecified: Secondary | ICD-10-CM | POA: Diagnosis not present

## 2022-01-02 DIAGNOSIS — R002 Palpitations: Secondary | ICD-10-CM | POA: Diagnosis not present

## 2022-01-02 DIAGNOSIS — Z79899 Other long term (current) drug therapy: Secondary | ICD-10-CM | POA: Insufficient documentation

## 2022-01-02 HISTORY — PX: TEE WITHOUT CARDIOVERSION: SHX5443

## 2022-01-02 HISTORY — PX: BUBBLE STUDY: SHX6837

## 2022-01-02 SURGERY — ECHOCARDIOGRAM, TRANSESOPHAGEAL
Anesthesia: Monitor Anesthesia Care

## 2022-01-02 MED ORDER — PROPOFOL 10 MG/ML IV BOLUS
INTRAVENOUS | Status: DC | PRN
  Administered 2022-01-02: 50 mg via INTRAVENOUS

## 2022-01-02 MED ORDER — SODIUM CHLORIDE 0.9 % IV SOLN: INTRAVENOUS | Status: DC

## 2022-01-02 MED ORDER — LIDOCAINE 2% (20 MG/ML) 5 ML SYRINGE
INTRAMUSCULAR | Status: DC | PRN
  Administered 2022-01-02: 100 mg via INTRAVENOUS

## 2022-01-02 MED ORDER — PROPOFOL 500 MG/50ML IV EMUL
INTRAVENOUS | Status: DC | PRN
  Administered 2022-01-02: 150 ug/kg/min via INTRAVENOUS

## 2022-01-02 MED ORDER — SODIUM CHLORIDE 0.9 % IV SOLN: INTRAVENOUS | Status: DC | PRN

## 2022-01-02 MED ORDER — PHENYLEPHRINE HCL-NACL 20-0.9 MG/250ML-% IV SOLN
INTRAVENOUS | Status: DC | PRN
  Administered 2022-01-02: 80 ug/min via INTRAVENOUS

## 2022-01-02 NOTE — Interval H&P Note (Signed)
History and Physical Interval Note: ? ?01/02/2022 ?10:47 AM ? ?Shirley Craig  has presented today for surgery, with the diagnosis of hx TIA PFO.  The various methods of treatment have been discussed with the patient and family. After consideration of risks, benefits and other options for treatment, the patient has consented to  Procedure(s): ?TRANSESOPHAGEAL ECHOCARDIOGRAM (TEE) (N/A) as a surgical intervention.  The patient's history has been reviewed, patient examined, no change in status, stable for surgery.  I have reviewed the patient's chart and labs.  Questions were answered to the patient's satisfaction.   ? ? ?Charlton Haws ? ? ?

## 2022-01-02 NOTE — CV Procedure (Signed)
TEE: ?Anesthesia:  Propofol ? 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 ?Given patients history of TIA while flying should consider closure of PFO/Atrial septal aneurysm as likely source of TIA ? ?Jenkins Rouge MD [facility] ?

## 2022-01-02 NOTE — Discharge Instructions (Signed)

## 2022-01-02 NOTE — Progress Notes (Signed)
Referral placed per Dr. Harriet Masson ?

## 2022-01-02 NOTE — Anesthesia Preprocedure Evaluation (Addendum)
Anesthesia Evaluation  ?Patient identified by MRN, date of birth, ID band ?Patient awake ? ? ? ?Reviewed: ?Allergy & Precautions, Patient's Chart, lab work & pertinent test results ? ?History of Anesthesia Complications ?Negative for: history of anesthetic complications ? ?Airway ?Mallampati: II ? ?TM Distance: >3 FB ?Neck ROM: Full ? ? ? Dental ?no notable dental hx. ? ?  ?Pulmonary ?sleep apnea ,  ?  ?Pulmonary exam normal ? ? ? ? ? ? ? Cardiovascular ?hypertension, Normal cardiovascular exam ? ?PFO ?  ?Neuro/Psych ?TIAnegative psych ROS  ? GI/Hepatic ?negative GI ROS, Neg liver ROS,   ?Endo/Other  ?Hypothyroidism  ? Renal/GU ?negative Renal ROS  ?negative genitourinary ?  ?Musculoskeletal ?negative musculoskeletal ROS ?(+)  ? Abdominal ?  ?Peds ? Hematology ?negative hematology ROS ?(+)   ?Anesthesia Other Findings ?Day of surgery medications reviewed with patient. ? Reproductive/Obstetrics ?negative OB ROS ? ?  ? ? ? ? ? ? ? ? ? ? ? ? ? ?  ?  ? ? ? ? ? ? ? ? ?Anesthesia Physical ?Anesthesia Plan ? ?ASA: 3 ? ?Anesthesia Plan: MAC  ? ?Post-op Pain Management: Minimal or no pain anticipated  ? ?Induction:  ? ?PONV Risk Score and Plan: Treatment may vary due to age or medical condition and Propofol infusion ? ?Airway Management Planned: Natural Airway and Nasal Cannula ? ?Additional Equipment: None ? ?Intra-op Plan:  ? ?Post-operative Plan:  ? ?Informed Consent: I have reviewed the patients History and Physical, chart, labs and discussed the procedure including the risks, benefits and alternatives for the proposed anesthesia with the patient or authorized representative who has indicated his/her understanding and acceptance.  ? ? ? ?Interpreter used for interveiw ? ?Plan Discussed with: CRNA ? ?Anesthesia Plan Comments:   ? ? ? ? ? ?Anesthesia Quick Evaluation ? ?

## 2022-01-02 NOTE — Progress Notes (Signed)
?  Echocardiogram ?Echocardiogram Transesophageal has been performed. ? ?Augustine Radar ?01/02/2022, 11:57 AM ?

## 2022-01-04 DIAGNOSIS — R002 Palpitations: Secondary | ICD-10-CM | POA: Diagnosis not present

## 2022-01-05 NOTE — Anesthesia Postprocedure Evaluation (Signed)
Anesthesia Post Note ? ?Patient: [name] ? ?Procedure(s) Performed: TRANSESOPHAGEAL ECHOCARDIOGRAM (TEE) ?BUBBLE STUDY ? ?  ? ?Patient location during evaluation: PACU ?Anesthesia Type: MAC ?Level of consciousness: awake and alert ?Pain management: pain level controlled ?Vital Signs Assessment: post-procedure vital signs reviewed and stable ?Respiratory status: spontaneous breathing, nonlabored ventilation and respiratory function stable ?Cardiovascular status: blood pressure returned to baseline ?Postop Assessment: no apparent nausea or vomiting ?Anesthetic complications: no ? ? ?No notable events documented. ? ?Last Vitals:  ?Vitals:  ? 01/02/22 1151 01/02/22 1211  ?BP: 106/69 112/62  ?Pulse: 64 69  ?Resp: 19 17  ?Temp:    ?SpO2: 100% 95%  ?  ?Last Pain:  ?Vitals:  ? 01/02/22 1211  ?TempSrc:   ?PainSc: 0-No pain  ? ?Pain Goal:   ? ?  ?  ?  ?  ?  ?  ?  ? ?Marthenia Rolling ? ? ? ? ?

## 2022-01-07 ENCOUNTER — Encounter (HOSPITAL_COMMUNITY): Payer: Self-pay | Admitting: Cardiovascular Disease

## 2022-01-07 NOTE — Transfer of Care (Signed)
Immediate Anesthesia Transfer of Care Note ? ?Patient: Shirley Craig ? ?Procedure(s) Performed: TRANSESOPHAGEAL ECHOCARDIOGRAM (TEE) ?BUBBLE STUDY ? ?Patient Location: PACU ? ?Anesthesia Type:MAC ? ?Level of Consciousness: awake ? ?Airway & Oxygen Therapy: Patient Spontanous Breathing and Patient connected to nasal cannula oxygen ? ?Post-op Assessment: Report given to RN ? ?Post vital signs: Reviewed and stable ? ?Last Vitals:  ?Vitals Value Taken Time  ?BP 112/62 01/02/22 1211  ?Temp 36.4 ?C 01/02/22 1140  ?Pulse 69 01/02/22 1211  ?Resp 17 01/02/22 1211  ?SpO2 95 % 01/02/22 1211  ? ? ?Last Pain:  ?Vitals:  ? 01/02/22 1211  ?TempSrc:   ?PainSc: 0-No pain  ?   ? ?  ? ?Complications: No notable events documented. ?

## 2022-01-08 ENCOUNTER — Telehealth: Payer: Self-pay

## 2022-01-08 NOTE — Telephone Encounter (Signed)
-----   Message from Sherren Mocha, MD sent at 01/03/2022  8:00 PM EST ----- ?Hey - not sure if you know about this one yet. PFO consult from Kardie/Nish. thx ?----- Message ----- ?From: Orvan July, RN ?Sent: 01/02/2022   1:45 PM EST ?To: Josue Hector, MD, Sherren Mocha, MD, # ? ?The referral has been placed.  ?-Delana Meyer, RN ?----- Message ----- ?From: Berniece Salines, DO ?Sent: 01/02/2022  12:00 PM EST ?To: Josue Hector, MD, Sherren Mocha, MD, # ? ?Thank you for the update. ? ?Jasmine please refer the patient to structural heart with Dr. Burt Knack. ?----- Message ----- ?From: Josue Hector, MD ?Sent: 01/02/2022  11:50 AM EST ?To: Sherren Mocha, MD, Berniece Salines, DO ? ?Atrial septal aneurysm 2 cm travel into LA with large PFO and shunting ?Likely cause of her TIA should have consideration to close  ? ? ? ? ?

## 2022-01-08 NOTE — Telephone Encounter (Signed)
Per Dr. Servando Salina, scheduled the patient for PFO consult with Dr. Excell Seltzer on 01/25/2022. ? ?Appointment made with the patient's husband (DPR) with help of [address] [place], #[phone]). ?

## 2022-01-25 ENCOUNTER — Other Ambulatory Visit: Payer: Self-pay

## 2022-01-25 ENCOUNTER — Ambulatory Visit (INDEPENDENT_AMBULATORY_CARE_PROVIDER_SITE_OTHER): Payer: 59 | Admitting: Cardiovascular Disease

## 2022-01-25 ENCOUNTER — Encounter: Payer: Self-pay | Admitting: Cardiovascular Disease

## 2022-01-25 VITALS — BP 120/72 | HR 88 | Ht 61.0 in | Wt 158.0 lb

## 2022-01-25 DIAGNOSIS — Q2112 Patent foramen ovale: Secondary | ICD-10-CM | POA: Diagnosis not present

## 2022-01-25 DIAGNOSIS — I253 Aneurysm of heart: Secondary | ICD-10-CM | POA: Diagnosis not present

## 2022-01-25 MED ORDER — CLOPIDOGREL BISULFATE 75 MG PO TABS: 75.0000 mg | ORAL_TABLET | Freq: Every day | ORAL | 5 refills | Status: DC

## 2022-01-25 NOTE — Patient Instructions (Addendum)
Medication Instructions:  ?START Clopidogrel (Plavix) 5 days prior to procedure and continue for 6 months ?*If you need a refill on your cardiac medications before your next appointment, please call your pharmacy* ? ? ?Lab Work: ?Will need CBC and BMET 30 days prior to procedure ?If you have labs (blood work) drawn today and your tests are completely normal, you will receive your results only by: ?MyChart Message (if you have MyChart) OR ?A paper copy in the mail ?If you have any lab test that is abnormal or we need to change your treatment, we will call you to review the results. ? ? ?Testing/Procedures: ?PFO Closure ? ? ?Follow-Up: ?At [facility], you and your health needs are our priority.  As part of our continuing mission to provide you with exceptional heart care, we have created designated Provider Care Teams.  These Care Teams include your primary Cardiologist (physician) and Advanced Practice Providers (APPs -  Physician Assistants and Nurse Practitioners) who all work together to provide you with the care you need, when you need it. ? ?Your next appointment:   ?Structural Team will contact ? ?Provider: ?Sherren Mocha, MD  ? ? ?Other Instructions ?See attached letter- date and time of procedure will be provided via Lenice Llamas, RN ?

## 2022-01-25 NOTE — Progress Notes (Signed)
?Cardiology Office Note:   ? ?Date:  01/26/2022  ? ?ID:  Shirley DienesRachida Craig, DOB May 10, 1966, MRN [id number] ? ?PCP:  Carilyn GoodpastureHester, Brooke, NP ?  ?[facility] Providers ?Cardiologist:  Thomasene RippleKardie Tobb, DO    ? ?Referring MD: Thomasene Rippleobb, Kardie, DO  ? ?Chief Complaint  ?Patient presents with  ? PFO  ? ? ?History of Present Illness:   ? ?Lauretta GrillRachida Craig is a 56 y.o. female with a hx of cryptogenic stroke and PFO, referred for consideration of PFO closure by Dr. Servando Salinaobb.  [place] in [place] and shortly thereafter developed right arm numbness and tingling as well as a feeling like the right side of her face was "paralyzed."  She was seen in the hospital and had some testing performed which reportedly was normal.  The patient underwent formal neurology consultation after she returned home.  Extensive testing was performed.  A lower extremity ultrasound was negative for DVT.  An MRI of the brain showed a small lacunar infarct in the right cerebellar hemisphere but otherwise was unremarkable.  CT angiogram of the chest showed some aortic atherosclerosis.  A carotid ultrasound was unremarkable.  An echo bubble study was positive for right to left intracardiac shunting.  A transesophageal echo was performed and showed large atrial septal aneurysm and PFO with strongly positive right to left shunting.  There were no valvular abnormalities.  LV and RV function were normal.  The patient presents today to discuss PFO closure.  She is here with her husband today.  A translator is present.  She complains of episodic chest pain that occurs at rest and feels like a tightness in her chest.  She has no exertional symptoms.  Symptoms are self-limited and only occur for a few minutes.  She thinks this may be stress related.  She has no known history of clotting disorder.  She has no family history of stroke.  She has no personal history of DVT or PE.  She has no history of bleeding problems or blood transfusion. ? ?Past  Medical History:  ?Diagnosis Date  ? History of bradycardia   ? History of palpitations   ? Hypertension   ? Hypothyroidism   ? Iron deficiency anemia   ? TIA (transient ischemic attack)   ? Vitamin D deficiency   ? ? ?Past Surgical History:  ?Procedure Laterality Date  ? BUBBLE STUDY  01/02/2022  ? Procedure: BUBBLE STUDY;  Surgeon: Wendall StadeNishan, Peter C, MD;  [facility];  Service: Cardiovascular;;  ? TEE WITHOUT CARDIOVERSION N/A 01/02/2022  ? Procedure: TRANSESOPHAGEAL ECHOCARDIOGRAM (TEE);  Surgeon: Wendall StadeNishan, Peter C, MD;  [facility];  Service: Cardiovascular;  Laterality: N/A;  ? ? ?Current Medications: ?Current Meds  ?Medication Sig  ? acetaminophen (TYLENOL) 500 MG tablet Take 500 mg by mouth every 6 (six) hours as needed (for pain.).  ? aspirin EC 81 MG tablet Take 81 mg by mouth in the morning. Swallow whole.  ? candesartan (ATACAND) 8 MG tablet Take 8 mg by mouth in the morning.  ? Cholecalciferol (VITAMIN D3 PO) Take 1 tablet by mouth in the morning.  ? clopidogrel (PLAVIX) 75 MG tablet Take 1 tablet (75 mg total) by mouth daily. DO NOT BEGIN TAKING MEDICATION UNTIL 5 DAYS PRIOR TO PROCEDURE  ? FEROSUL 325 (65 Fe) MG tablet Take 325 mg by mouth in the morning.  ? ibuprofen (ADVIL) 200 MG tablet Take 400 mg by mouth every 8 (eight) hours as needed (for pain.).  ?  rosuvastatin (CRESTOR) 10 MG tablet Take 10 mg by mouth every evening.  ?  ? ?Allergies:   Patient has no known allergies.  ? ?Social History  ? ?Socioeconomic History  ? Marital status: Married  ?  Spouse name: Not on file  ? Number of children: 3  ? Years of education: 66  ? Highest education level: Not on file  ?Occupational History  ? Not on file  ?Tobacco Use  ? Smoking status: Never  ? Smokeless tobacco: Never  ?Vaping Use  ? Vaping Use: Never used  ?Substance and Sexual Activity  ? Alcohol use: Never  ? Drug use: Never  ? Sexual activity: Not on file  ?Other Topics Concern  ? Not on file  ?Social History Narrative  ? Right  handed  ? Drinks caffeine  ? One story home  ? ?Social Determinants of Health  ? ?Financial Resource Strain: Not on file  ?Food Insecurity: Not on file  ?Transportation Needs: Not on file  ?Physical Activity: Not on file  ?Stress: Not on file  ?Social Connections: Not on file  ?  ? ?Family History: ?The patient's family history includes Hypertension in her father and mother. ? ?ROS:   ?Please see the history of present illness.    ?All other systems reviewed and are negative. ? ?EKGs/Labs/Other Studies Reviewed:   ? ?The following studies were reviewed today: ?Event Monitor: ?Patient had a min HR of 44 bpm, max HR of 167 bpm, and avg HR of 80 bpm. Predominant underlying rhythm was Sinus Rhythm. First Degree AV Block was present. 1 run of Supraventricular Tachycardia occurred lasting 16 beats with a max rate of 167 bpm (avg  ?156 bpm). Isolated SVEs were rare (<1.0%), SVE Couplets were rare (<1.0%), and SVE Triplets were rare (<1.0%). Isolated VEs were rare (<1.0%), VE Couplets were rare (<1.0%), and no VE Triplets were present. ? ? ?Recent Labs: ?12/31/2021: BUN 9; Creatinine, Ser 0.59; Hemoglobin 11.5; Magnesium 2.2; Platelets 436; Potassium 4.7; Sodium 140  ?Recent Lipid Panel ?   ?Component Value Date/Time  ? CHOL 187 10/22/2010 2112  ? TRIG 96 10/22/2010 2112  ? HDL 44 10/22/2010 2112  ? CHOLHDL 4.3 Ratio 10/22/2010 2112  ? VLDL 19 10/22/2010 2112  ? LDLCALC 124 (H) 10/22/2010 2112  ? ? ? ?Risk Assessment/Calculations:   ?  ? ?    ? ?Physical Exam:   ? ?VS:  BP 120/72   Pulse 88   Ht 5\' 1"  (1.549 m)   Wt 158 lb (71.7 kg)   LMP 11/25/2018   SpO2 98%   BMI 29.85 kg/m?    ? ?Wt Readings from Last 3 Encounters:  ?01/25/22 158 lb (71.7 kg)  ?01/02/22 159 lb 9.6 oz (72.4 kg)  ?12/31/21 159 lb 9.6 oz (72.4 kg)  ?  ? ?GEN:  Well nourished, well developed in no acute distress ?HEENT: Normal ?NECK: No JVD; No carotid bruits ?LYMPHATICS: No lymphadenopathy ?CARDIAC: RRR, no murmurs, rubs, gallops ?RESPIRATORY:  Clear  to auscultation without rales, wheezing or rhonchi  ?ABDOMEN: Soft, non-tender, non-distended ?MUSCULOSKELETAL:  No edema; No deformity  ?SKIN: Warm and dry ?NEUROLOGIC:  Alert and oriented x 3 ?PSYCHIATRIC:  Normal affect  ? ?ASSESSMENT:   ? ?1. PFO with atrial septal aneurysm   ? ?PLAN:   ? ?In order of problems listed above: ? ?Patient's TEE is reviewed and shows a large PFO with atrial septal aneurysm.  We discussed the association of PFO and cryptogenic stroke.  Available clinical  trial data is discussed.  We contrasted outcomes with medical therapy using antiplatelet or anticoagulant agents compared with transcatheter PFO closure.   ? ?For this patients PFO, there is a high likelihood of contribution to stroke/TIA with a RoPE score of 7 to 10 ROPE Score = 7 ? ?Above score calculated as 1 point each if NOT present [HTN, DM2, h/o CVA/TIA, Smoker, Cortical imaging on infarct] ?Above score calculated as 5 points if [Age 56 to 29], 4 points if [Age 66 to 39], 3 points if [Age 59 to 49], 2 points if [Age 38 to 59], 1 point if [Age 23 to 69], 0 points if [Age > 70] ? ?RoPE score is total sum of individual points (higher total is LESS risk of stroke)  ? ?I reviewed the transcatheter PFO closure procedure with the patient and her husband at length.  I demonstrated the Amplatzer PFO occluder device, reviewed procedural steps, discussed potential complications and expected outcomes.  They understand the complications of bleeding, infection, erosion, device embolization, stroke, myocardial infarction, cardiac injury, cardiac tamponade, hemopericardium, need for emergent cardiac surgery, major arrhythmia, and death, occur at a low incidence of 1% or less.  After full review of risks, benefits, and alternatives, the patient provides informed consent.  She will be started on clopidogrel 75 mg daily 4 to 5 days before the procedure.  She understands the need to continue aspirin and clopidogrel for a period of 6 months  following transcatheter PFO closure as well as to follow SBE prophylaxis guidelines for 6 months after the procedure. ? ?   ? ?   ? ? ?Medication Adjustments/Labs and Tests Ordered: ?Current medicines are reviewed at [employer]

## 2022-01-26 ENCOUNTER — Encounter: Payer: Self-pay | Admitting: Cardiovascular Disease

## 2022-01-28 ENCOUNTER — Telehealth: Payer: Self-pay

## 2022-01-28 NOTE — Telephone Encounter (Signed)
Confirmed with [employer] rep he can come 5/3. ?Discussed with Shirley Craig. Confirmed procedure will take 5/3.  ?Scheduled Shirley Craig for pre-procedure visit 02/27/2022. Informed him she will have blood work, EKG, and get instruction letter for 5/3 at that visit. ?He was grateful for call and agrees with plan.  ?

## 2022-01-28 NOTE — Telephone Encounter (Signed)
Per patient request, called her son Olga Coaster- phone #[phone]) to discuss timing of PFO closure as 4/21 is the last day of Ramadan. ? ?He agrees to May 2 or May 3. He understands he will be called with whichever day will work better logistically. ? ?He was grateful for follow-up.  ?

## 2022-02-26 NOTE — Progress Notes (Signed)
?[facility]   ?MULTIDISCIPLINARY HEART VALVE CLINIC ?                                    ?Cardiology Office Note:   ? ?Date:  02/27/2022  ? ?ID:  Shirley Craig, DOB 04/29/1966, MRN [id number] ? ?PCP:  Hester, Brooke, NP  ?[facility] Cardiologist:  Kardie Tobb, DO/ Dr. Cooper, MD (PFO) ?[facility] Electrophysiologist:  None  ? ?Referring MD: Hester, Brooke, NP  ? ?Chief Complaint  ?Patient presents with  ? Follow-up  ?  Pre PFO closure  ? ?History of Present Illness:   ? ?Shirley Craig is a 55 y.o. female with a hx of cryptogenic stroke and PFO.  ? ?[name] was referred to Dr. Cooper for the evaluation of PFO closure. She [place] a [place] in 06/2021 and shortly thereafter developed right arm numbness and tingling, along with facial numbness. She was seen in the hospital there and underwent further testing which was reportedly found to be normal. She was referred to neurology after returning home with extensive testing with a brain MRI which showed a small lacunar infarct in the right cerebellar hemisphere but otherwise was unremarkable. CT angiogram of the chest showed some aortic atherosclerosis.  A carotid ultrasound was unremarkable. An echo bubble study was positive for right to left intracardiac shunting. 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 There were no valvular abnormalities. LV and RV function were normal. On Dr. Cooper's evaluation, she had complaints of chest pain however no exertional symptoms. Plan at that time was to proceed with PFO closure scheduled for 03/06/22. Plan was to start Plavix 75mg 4-5 days prior to procedure and continue ASA and Plavix after implant for a period of 6 months. She will need SBE for 6 months post procedure.  ? ?Today she is here with her husband and interpreter. She is doing well and reports no further neuro symptoms. She has been tolerating medications without  complication. She denies chest pain, palpitations, changes in vision or sensation. No LE edema, orthopnea, dizziness, or syncope.  ? ?Past Medical History:  ?Diagnosis Date  ? History of bradycardia   ? History of palpitations   ? Hypertension   ? Hypothyroidism   ? Iron deficiency anemia   ? TIA (transient ischemic attack)   ? Vitamin D deficiency   ? ? ?Past Surgical History:  ?Procedure Laterality Date  ? BUBBLE STUDY  01/02/2022  ? Procedure: BUBBLE STUDY;  Surgeon: Nishan, Peter C, MD;  Location: [facility];  Service: Cardiovascular;;  ? TEE WITHOUT CARDIOVERSION N/A 01/02/2022  ? Procedure: TRANSESOPHAGEAL ECHOCARDIOGRAM (TEE);  Surgeon: Nishan, Peter C, MD;  Location: [facility]OS[facility];  Service: Cardiovascular;  Laterality: N/A;  ? ? ?Current Medications: ?Current Meds  ?Medication Sig  ? acetaminophen (TYLENOL) 500 MG tablet Take 500 mg by mouth every 6 (six) hours as needed (for pain.).  ? aspirin EC 81 MG tablet Take 81 mg by mouth in the morning. Swallow whole.  ? candesartan (ATACAND) 8 MG tablet Take 8 mg by mouth in the morning.  ? Cholecalciferol (VITAMIN D3 PO) Take 1 tablet by mouth in the morning.  ? clopidogrel (PLAVIX) 75 MG tablet Take 1 tablet (75 mg total) by mouth daily. DO NOT BEGIN TAKING MEDICATION UNTIL 5 DAYS PRIOR TO PROCEDURE  ? FEROSUL 325 (65 Fe) MG tablet   Take 325 mg by mouth in the morning.  ? ibuprofen (ADVIL) 200 MG tablet Take 400 mg by mouth every 8 (eight) hours as needed (for pain.).  ? rosuvastatin (CRESTOR) 10 MG tablet Take 10 mg by mouth every evening.  ?  ? ?Allergies:   Patient has no known allergies.  ? ?Social History  ? ?Socioeconomic History  ? Marital status: Married  ?  Spouse name: Not on file  ? Number of children: 3  ? Years of education: 12  ? Highest education level: Not on file  ?Occupational History  ? Not on file  ?Tobacco Use  ? Smoking status: Never  ? Smokeless tobacco: Never  ?Vaping Use  ? Vaping Use: Never used  ?Substance and Sexual Activity  ? Alcohol  use: Never  ? Drug use: Never  ? Sexual activity: Not on file  ?Other Topics Concern  ? Not on file  ?Social History Narrative  ? Right handed  ? Drinks caffeine  ? One story home  ? ?Social Determinants of Health  ? ?Financial Resource Strain: Not on file  ?Food Insecurity: Not on file  ?Transportation Needs: Not on file  ?Physical Activity: Not on file  ?Stress: Not on file  ?Social Connections: Not on file  ?  ? ?Family History: ?The patient's family history includes Hypertension in her father and mother. ? ?ROS:   ?Please see the history of present illness.    ?All other systems reviewed and are negative. ? ?EKGs/Labs/Other Studies Reviewed:   ? ?The following studies were reviewed today: ? ?Echo TEE 01/02/22: ? ? 1. Large atrial septal aneurysm with 1.8 cm excursion into LA associated  ?with large PFO and left to right shunting on color flow. Very positve  ?bubble study with right to left shunting Discussed with Dr Cooper Refer  ?for closure given clinical history of  ?TIA.  ? 2. Left ventricular ejection fraction, by estimation, is 60 to 65%. The  ?left ventricle has normal function. The left ventricle has no regional  ?wall motion abnormalities.  ? 3. Right ventricular systolic function is normal. The right ventricular  ?size is normal.  ? 4. No left atrial/left atrial appendage thrombus was detected.  ? 5. The mitral valve is normal in structure. No evidence of mitral valve  ?regurgitation. No evidence of mitral stenosis.  ? 6. The aortic valve is normal in structure. Aortic valve regurgitation is  ?not visualized. No aortic stenosis is present.  ? 7. The inferior vena cava is normal in size with greater than 50%  ?respiratory variability, suggesting right atrial pressure of 3 mmHg.  ? 8. Evidence of atrial level shunting detected by color flow Doppler.  ?Agitated saline contrast bubble study was positive with shunting observed  ?within 3-6 cardiac cycles suggestive of interatrial shunt.   ? ?Conclusion(s)/Recommendation(s): Normal biventricular function without  ?evidence of hemodynamically significant valvular heart disease. ? ?ZIO monitor 01/22/22: ? ?Patch Wear Time:  13 days and 22 hours (2023-03-03T14:25:06-499 to 2023-03-17T14:08:02-399) ?  ?Patient had a min HR of 44 bpm, max HR of 167 bpm, and avg HR of 80 bpm. Predominant underlying rhythm was Sinus Rhythm. First Degree AV Block was present.  Isolated SVEs were rare (<1.0%), SVE Couplets were rare (<1.0%), and SVE Triplets were rare (<1.0%). Isolated VEs were rare (<1.0%), VE Couplets were rare (<1.0%), and no VE Triplets were present. ?  ?Conclusion: Normal unremarkable study with no evidence of significant arrhythmia. ? ?Echo with bubble 10/25/21: ? ? 1.   Left ventricular ejection fraction, by estimation, is 60 to 65%. The  ?left ventricle has normal function. The left ventricle has no regional  ?wall motion abnormalities. Left ventricular diastolic parameters are  ?consistent with Grade I diastolic  ?dysfunction (impaired relaxation).  ? 2. Right ventricular systolic function is normal. The right ventricular  ?size is normal. There is normal pulmonary artery systolic pressure.  ? 3. The mitral valve is normal in structure. No evidence of mitral valve  ?regurgitation. No evidence of mitral stenosis.  ? 4. The aortic valve is normal in structure. Aortic valve regurgitation is  ?not visualized. No aortic stenosis is present.  ? 5. The inferior vena cava is normal in size with greater than 50%  ?respiratory variability, suggesting right atrial pressure of 3 mmHg.  ? 6. Agitated saline contrast bubble study was positive with shunting  ?observed within 3-6 cardiac cycles suggestive of interatrial shunt. There  ?is a moderately sized patent foramen ovale with predominantly right to  ?left shunting across the atrial septum.  ? ?EKG:  EKG is ordered today.  The ekg ordered today demonstrates NSR with HR, 84bpm ? ?Recent Labs: ?12/31/2021: BUN 9;  Creatinine, Ser 0.59; Hemoglobin 11.5; Magnesium 2.2; Platelets 436; Potassium 4.7; Sodium 140  ? ?Recent Lipid Panel ?   ?Component Value Date/Time  ? CHOL 187 10/22/2010 2112  ? TRIG 96 10/22/2010 2112  ? HDL 44 10/22/2010 2112

## 2022-02-26 NOTE — H&P (View-Only) (Signed)
?[facility]   ?[place] ?                                    ?Cardiology Office Note:   ? ?Date:  02/27/2022  ? ?ID:  Shirley Craig, DOB [birth year], MRN [id number] ? ?PCP:  Almedia Balls, NP  ?[facility] Cardiologist:  Berniece Salines, DO/ Dr. Burt Knack, MD (PFO) ?[place] Electrophysiologist:  None  ? ?Referring MD: Almedia Balls, NP  ? ?Chief Complaint  ?Patient presents with  ? Follow-up  ?  Pre PFO closure  ? ?History of Present Illness:   ? ?Shirley Craig is a 56 y.o. female with a hx of cryptogenic stroke and PFO.  ? ?[name] was referred to Dr. Burt Knack for the evaluation of PFO closure. [place] and shortly thereafter developed right arm numbness and tingling, along with facial numbness. She was seen in the hospital there and underwent further testing which was reportedly found to be normal. She was referred to neurology after returning home with extensive testing with a brain MRI which showed a small lacunar infarct in the right cerebellar hemisphere but otherwise was unremarkable. CT angiogram of the chest showed some aortic atherosclerosis.  A carotid ultrasound was unremarkable. An echo bubble study was positive for right to left intracardiac shunting. 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 There were no valvular abnormalities. LV and RV function were normal. On Dr. Antionette Char evaluation, she had complaints of chest pain however no exertional symptoms. Plan at that time was to proceed with PFO closure scheduled for 03/06/22. Plan was to start Plavix 75mg  4-5 days prior to procedure and continue ASA and Plavix after implant for a period of 6 months. She will need SBE for 6 months post procedure.  ? ?Today she is here with her husband and interpreter. She is doing well and reports no further neuro symptoms. She has been tolerating medications without  complication. She denies chest pain, palpitations, changes in vision or sensation. No LE edema, orthopnea, dizziness, or syncope.  ? ?Past Medical History:  ?Diagnosis Date  ? History of bradycardia   ? History of palpitations   ? Hypertension   ? Hypothyroidism   ? Iron deficiency anemia   ? TIA (transient ischemic attack)   ? Vitamin D deficiency   ? ? ?Past Surgical History:  ?Procedure Laterality Date  ? BUBBLE STUDY  01/02/2022  ? Procedure: BUBBLE STUDY;  Surgeon: Josue Hector, MD;  Location: [facility];  Service: Cardiovascular;;  ? TEE WITHOUT CARDIOVERSION N/A 01/02/2022  ? Procedure: TRANSESOPHAGEAL ECHOCARDIOGRAM (TEE);  Surgeon: Josue Hector, MD;  Location: [facility];  Service: Cardiovascular;  Laterality: N/A;  ? ? ?Current Medications: ?Current Meds  ?Medication Sig  ? acetaminophen (TYLENOL) 500 MG tablet Take 500 mg by mouth every 6 (six) hours as needed (for pain.).  ? aspirin EC 81 MG tablet Take 81 mg by mouth in the morning. Swallow whole.  ? candesartan (ATACAND) 8 MG tablet Take 8 mg by mouth in the morning.  ? Cholecalciferol (VITAMIN D3 PO) Take 1 tablet by mouth in the morning.  ? clopidogrel (PLAVIX) 75 MG tablet Take 1 tablet (75 mg total) by mouth daily. DO NOT BEGIN TAKING MEDICATION UNTIL 5 DAYS PRIOR TO PROCEDURE  ? FEROSUL 325 (65 Fe) MG tablet  Take 325 mg by mouth in the morning.  ? ibuprofen (ADVIL) 200 MG tablet Take 400 mg by mouth every 8 (eight) hours as needed (for pain.).  ? rosuvastatin (CRESTOR) 10 MG tablet Take 10 mg by mouth every evening.  ?  ? ?Allergies:   Patient has no known allergies.  ? ?Social History  ? ?Socioeconomic History  ? Marital status: Married  ?  Spouse name: Not on file  ? Number of children: 3  ? Years of education: 6  ? Highest education level: Not on file  ?Occupational History  ? Not on file  ?Tobacco Use  ? Smoking status: Never  ? Smokeless tobacco: Never  ?Vaping Use  ? Vaping Use: Never used  ?Substance and Sexual Activity  ? Alcohol  use: Never  ? Drug use: Never  ? Sexual activity: Not on file  ?Other Topics Concern  ? Not on file  ?Social History Narrative  ? Right handed  ? Drinks caffeine  ? One story home  ? ?Social Determinants of Health  ? ?Financial Resource Strain: Not on file  ?Food Insecurity: Not on file  ?Transportation Needs: Not on file  ?Physical Activity: Not on file  ?Stress: Not on file  ?Social Connections: Not on file  ?  ? ?Family History: ?The patient's family history includes Hypertension in her father and mother. ? ?ROS:   ?Please see the history of present illness.    ?All other systems reviewed and are negative. ? ?EKGs/Labs/Other Studies Reviewed:   ? ?The following studies were reviewed today: ? ?Echo TEE 01/02/22: ? ? 1. Large atrial septal aneurysm with 1.8 cm excursion into LA associated  ?with large PFO and left to right shunting on color flow. Very positve  ?bubble study with right to left shunting Discussed with Dr Burt Knack Refer  ?for closure given clinical history of  ?TIA.  ? 2. Left ventricular ejection fraction, by estimation, is 60 to 65%. The  ?left ventricle has normal function. The left ventricle has no regional  ?wall motion abnormalities.  ? 3. Right ventricular systolic function is normal. The right ventricular  ?size is normal.  ? 4. No left atrial/left atrial appendage thrombus was detected.  ? 5. The mitral valve is normal in structure. No evidence of mitral valve  ?regurgitation. No evidence of mitral stenosis.  ? 6. The aortic valve is normal in structure. Aortic valve regurgitation is  ?not visualized. No aortic stenosis is present.  ? 7. The inferior vena cava is normal in size with greater than 50%  ?respiratory variability, suggesting right atrial pressure of 3 mmHg.  ? 8. Evidence of atrial level shunting detected by color flow Doppler.  ?Agitated saline contrast bubble study was positive with shunting observed  ?within 3-6 cardiac cycles suggestive of interatrial shunt.   ? ?Conclusion(s)/Recommendation(s): Normal biventricular function without  ?evidence of hemodynamically significant valvular heart disease. ? ?ZIO monitor 01/22/22: ? ?Patch Wear Time:  13 days and 22 hours (2023-03-03T14:25:06-499 to 2023-03-17T14:08:02-399) ?  ?Patient had a min HR of 44 bpm, max HR of 167 bpm, and avg HR of 80 bpm. Predominant underlying rhythm was Sinus Rhythm. First Degree AV Block was present.  Isolated SVEs were rare (<1.0%), SVE Couplets were rare (<1.0%), and SVE Triplets were rare (<1.0%). Isolated VEs were rare (<1.0%), VE Couplets were rare (<1.0%), and no VE Triplets were present. ?  ?Conclusion: Normal unremarkable study with no evidence of significant arrhythmia. ? ?Echo with bubble 10/25/21: ? ? 1.  Left ventricular ejection fraction, by estimation, is 60 to 65%. The  ?left ventricle has normal function. The left ventricle has no regional  ?wall motion abnormalities. Left ventricular diastolic parameters are  ?consistent with Grade I diastolic  ?dysfunction (impaired relaxation).  ? 2. Right ventricular systolic function is normal. The right ventricular  ?size is normal. There is normal pulmonary artery systolic pressure.  ? 3. The mitral valve is normal in structure. No evidence of mitral valve  ?regurgitation. No evidence of mitral stenosis.  ? 4. The aortic valve is normal in structure. Aortic valve regurgitation is  ?not visualized. No aortic stenosis is present.  ? 5. The inferior vena cava is normal in size with greater than 50%  ?respiratory variability, suggesting right atrial pressure of 3 mmHg.  ? 6. Agitated saline contrast bubble study was positive with shunting  ?observed within 3-6 cardiac cycles suggestive of interatrial shunt. There  ?is a moderately sized patent foramen ovale with predominantly right to  ?left shunting across the atrial septum.  ? ?EKG:  EKG is ordered today.  The ekg ordered today demonstrates NSR with HR, 84bpm ? ?Recent Labs: ?12/31/2021: BUN 9;  Creatinine, Ser 0.59; Hemoglobin 11.5; Magnesium 2.2; Platelets 436; Potassium 4.7; Sodium 140  ? ?Recent Lipid Panel ?   ?Component Value Date/Time  ? CHOL 187 10/22/2010 2112  ? TRIG 96 10/22/2010 2112  ? HDL 44 10/22/2010 2112

## 2022-02-27 ENCOUNTER — Ambulatory Visit (INDEPENDENT_AMBULATORY_CARE_PROVIDER_SITE_OTHER): Payer: 59 | Admitting: Cardiology

## 2022-02-27 VITALS — BP 133/84 | HR 84 | Ht 61.0 in | Wt 156.0 lb

## 2022-02-27 DIAGNOSIS — Q2112 Patent foramen ovale: Secondary | ICD-10-CM

## 2022-02-27 DIAGNOSIS — Z8673 Personal history of transient ischemic attack (TIA), and cerebral infarction without residual deficits: Secondary | ICD-10-CM

## 2022-02-27 DIAGNOSIS — I253 Aneurysm of heart: Secondary | ICD-10-CM | POA: Diagnosis not present

## 2022-02-27 NOTE — Patient Instructions (Signed)
Medication Instructions:  ?Your physician recommends that you continue on your current medications as directed. Please refer to the Current Medication list given to you today.  ?*If you need a refill on your cardiac medications before your next appointment, please call your pharmacy* ? ? ?Lab Work: ?TODAY: BMET, CBC ?If you have labs (blood work) drawn today and your tests are completely normal, you will receive your results only by: ?MyChart Message (if you have MyChart) OR ?A paper copy in the mail ?If you have any lab test that is abnormal or we need to change your treatment, we will call you to review the results. ? ? ?Testing/Procedures: ?SEE INSTRUCTION LETTER ? ?Follow-Up: ?At [facility], you and your health needs are our priority.  As part of our continuing mission to provide you with exceptional heart care, we have created designated Provider Care Teams.  These Care Teams include your primary Cardiologist (physician) and Advanced Practice Providers (APPs -  Physician Assistants and Nurse Practitioners) who all work together to provide you with the care you need, when you need it. ? ?We recommend signing up for the patient portal called "MyChart".  Sign up information is provided on this After Visit Summary.  MyChart is used to connect with patients for Virtual Visits (Telemedicine).  Patients are able to view lab/test results, encounter notes, upcoming appointments, etc.  Non-urgent messages can be sent to your provider as well.   ?To learn more about what you can do with MyChart, go to [email or web address].   ? ?Your next appointment:   ?KEEP SCHEDULED FOLLOW-UP ? ?Important Information About Sugar ? ? ? ? ?  ?

## 2022-02-28 LAB — CBC
Hematocrit: 37.9 % (ref 34.0–46.6)
Hemoglobin: 12.5 g/dL (ref 11.1–15.9)
MCH: 29.4 pg (ref 26.6–33.0)
MCHC: 33 g/dL (ref 31.5–35.7)
MCV: 89 fL (ref 79–97)
Platelets: 369 10*3/uL (ref 150–450)
RBC: 4.25 x10E6/uL (ref 3.77–5.28)
RDW: 13.3 % (ref 11.7–15.4)
WBC: 6.2 10*3/uL (ref 3.4–10.8)

## 2022-02-28 LAB — BASIC METABOLIC PANEL
BUN/Creatinine Ratio: 22 (ref 9–23)
BUN: 13 mg/dL (ref 6–24)
CO2: 23 mmol/L (ref 20–29)
Calcium: 9.3 mg/dL (ref 8.7–10.2)
Chloride: 105 mmol/L (ref 96–106)
Creatinine, Ser: 0.58 mg/dL (ref 0.57–1.00)
Glucose: 101 mg/dL — ABNORMAL HIGH (ref 70–99)
Potassium: 4.6 mmol/L (ref 3.5–5.2)
Sodium: 143 mmol/L (ref 134–144)
eGFR: 105 mL/min/{1.73_m2} (ref >59)

## 2022-03-04 ENCOUNTER — Telehealth: Payer: Self-pay | Admitting: *Deleted

## 2022-03-04 NOTE — Telephone Encounter (Signed)
PFO Closure scheduled at [facility] for: Wednesday Mar 06, 2022 8:30 AM ?Arrival time and place: [facility] Main Entrance A at: 6:30 AM ? ? ?No solid food after midnight prior to cath, clear liquids until 5 AM day of procedure. ? ?Medication instructions: ?-Usual morning medications can be taken with sips of water including aspirin 81 mg and Plavix 75 mg ? ?Confirmed patient has responsible adult to drive home post procedure and be with patient first 24 hours after arriving home. ? ?Patient reports no new symptoms concerning for COVID-19/no exposure to COVID-19 in the past 10 days. ? ?With assistance of [employer] ID [id number]-reviewed procedure instructions with patient.  ?

## 2022-03-06 ENCOUNTER — Other Ambulatory Visit: Payer: Self-pay

## 2022-03-06 ENCOUNTER — Encounter (HOSPITAL_COMMUNITY): Payer: Self-pay | Admitting: Cardiovascular Disease

## 2022-03-06 ENCOUNTER — Ambulatory Visit (HOSPITAL_BASED_OUTPATIENT_CLINIC_OR_DEPARTMENT_OTHER): Payer: 59

## 2022-03-06 ENCOUNTER — Ambulatory Visit (HOSPITAL_COMMUNITY)
Admission: RE | Admit: 2022-03-06 | Discharge: 2022-03-06 | Disposition: A | Discharge status: Home / Self Care | Payer: 59 | Attending: Cardiovascular Disease | Admitting: Cardiovascular Disease

## 2022-03-06 ENCOUNTER — Ambulatory Visit (HOSPITAL_COMMUNITY)
Admission: RE | Disposition: A | Discharge status: Home / Self Care | Payer: 59 | Source: Home / Self Care | Attending: Cardiovascular Disease

## 2022-03-06 DIAGNOSIS — Z7902 Long term (current) use of antithrombotics/antiplatelets: Secondary | ICD-10-CM | POA: Insufficient documentation

## 2022-03-06 DIAGNOSIS — Q2112 Patent foramen ovale: Secondary | ICD-10-CM | POA: Insufficient documentation

## 2022-03-06 DIAGNOSIS — Z7982 Long term (current) use of aspirin: Secondary | ICD-10-CM | POA: Diagnosis not present

## 2022-03-06 DIAGNOSIS — Z79899 Other long term (current) drug therapy: Secondary | ICD-10-CM | POA: Insufficient documentation

## 2022-03-06 DIAGNOSIS — Z8673 Personal history of transient ischemic attack (TIA), and cerebral infarction without residual deficits: Secondary | ICD-10-CM | POA: Diagnosis not present

## 2022-03-06 DIAGNOSIS — I253 Aneurysm of heart: Secondary | ICD-10-CM

## 2022-03-06 HISTORY — PX: PATENT FORAMEN OVALE(PFO) CLOSURE: CATH118300

## 2022-03-06 LAB — POCT I-STAT, CHEM 8
BUN: 20 mg/dL (ref 6–20)
Calcium, Ion: 1.14 mmol/L — ABNORMAL LOW (ref 1.15–1.40)
Chloride: 104 mmol/L (ref 98–111)
Creatinine, Ser: 0.8 mg/dL (ref 0.44–1.00)
Glucose, Bld: 115 mg/dL — ABNORMAL HIGH (ref 70–99)
HCT: 44 % (ref 36.0–46.0)
Hemoglobin: 15 g/dL (ref 12.0–15.0)
Potassium: 4.1 mmol/L (ref 3.5–5.1)
Sodium: 139 mmol/L (ref 135–145)
TCO2: 24 mmol/L (ref 22–32)

## 2022-03-06 LAB — POCT ACTIVATED CLOTTING TIME
Activated Clotting Time: 215 seconds
Activated Clotting Time: 239 seconds
Activated Clotting Time: 197 seconds
Activated Clotting Time: 341 seconds
Activated Clotting Time: 281 seconds

## 2022-03-06 LAB — ECHOCARDIOGRAM LIMITED
Height: 64.173 in
Weight: 2512 oz

## 2022-03-06 SURGERY — PATENT FORAMEN OVALE (PFO) CLOSURE
Anesthesia: LOCAL

## 2022-03-06 MED ORDER — FENTANYL CITRATE (PF) 100 MCG/2ML IJ SOLN
INTRAMUSCULAR | Status: DC | PRN
  Administered 2022-03-06 (×3): 25 ug via INTRAVENOUS

## 2022-03-06 MED ORDER — FENTANYL CITRATE (PF) 100 MCG/2ML IJ SOLN
INTRAMUSCULAR | Status: AC
  Filled 2022-03-06: qty 2

## 2022-03-06 MED ORDER — SODIUM CHLORIDE 0.9% FLUSH: 3.0000 mL | INTRAVENOUS | Status: DC | PRN

## 2022-03-06 MED ORDER — IOHEXOL 350 MG/ML SOLN: INTRAVENOUS | Status: DC | PRN

## 2022-03-06 MED ORDER — HYDRALAZINE HCL 20 MG/ML IJ SOLN: 10.0000 mg | INTRAMUSCULAR | Status: DC | PRN

## 2022-03-06 MED ORDER — ACETAMINOPHEN 325 MG PO TABS: 650.0000 mg | ORAL_TABLET | ORAL | Status: DC | PRN

## 2022-03-06 MED ORDER — LIDOCAINE HCL (PF) 1 % IJ SOLN
INTRAMUSCULAR | Status: DC | PRN
  Administered 2022-03-06: 15 mL

## 2022-03-06 MED ORDER — SODIUM CHLORIDE 0.9 % IV SOLN: INTRAVENOUS | Status: DC

## 2022-03-06 MED ORDER — CEFAZOLIN SODIUM-DEXTROSE 2-4 GM/100ML-% IV SOLN
2.0000 g | INTRAVENOUS | Status: AC
  Administered 2022-03-06: 2 g via INTRAVENOUS
  Filled 2022-03-06: qty 100

## 2022-03-06 MED ORDER — SODIUM CHLORIDE 0.9% FLUSH: 3.0000 mL | Freq: Two times a day (BID) | INTRAVENOUS | Status: DC

## 2022-03-06 MED ORDER — HEPARIN SODIUM (PORCINE) 1000 UNIT/ML IJ SOLN
INTRAMUSCULAR | Status: AC
  Filled 2022-03-06: qty 10

## 2022-03-06 MED ORDER — CEFAZOLIN SODIUM-DEXTROSE 2-4 GM/100ML-% IV SOLN
INTRAVENOUS | Status: AC
  Filled 2022-03-06: qty 100

## 2022-03-06 MED ORDER — MIDAZOLAM HCL 2 MG/2ML IJ SOLN
INTRAMUSCULAR | Status: AC
  Filled 2022-03-06: qty 2

## 2022-03-06 MED ORDER — ASPIRIN 81 MG PO CHEW: 81.0000 mg | CHEWABLE_TABLET | ORAL | Status: DC

## 2022-03-06 MED ORDER — SODIUM CHLORIDE 0.9 % WEIGHT BASED INFUSION
3.0000 mL/kg/h | INTRAVENOUS | Status: AC
  Administered 2022-03-06: 3 mL/kg/h via INTRAVENOUS

## 2022-03-06 MED ORDER — CLOPIDOGREL BISULFATE 75 MG PO TABS: 75.0000 mg | ORAL_TABLET | ORAL | Status: DC

## 2022-03-06 MED ORDER — SODIUM CHLORIDE 0.9 % IV SOLN: 250.0000 mL | INTRAVENOUS | Status: DC | PRN

## 2022-03-06 MED ORDER — LIDOCAINE HCL (PF) 1 % IJ SOLN
INTRAMUSCULAR | Status: AC
  Filled 2022-03-06: qty 30

## 2022-03-06 MED ORDER — MIDAZOLAM HCL 2 MG/2ML IJ SOLN
INTRAMUSCULAR | Status: DC | PRN
  Administered 2022-03-06 (×2): 1 mg via INTRAVENOUS
  Administered 2022-03-06: 2 mg via INTRAVENOUS

## 2022-03-06 MED ORDER — HEPARIN (PORCINE) IN NACL 1000-0.9 UT/500ML-% IV SOLN
INTRAVENOUS | Status: AC
  Filled 2022-03-06: qty 1000

## 2022-03-06 MED ORDER — SODIUM CHLORIDE 0.9 % WEIGHT BASED INFUSION: 1.0000 mL/kg/h | INTRAVENOUS | Status: DC

## 2022-03-06 MED ORDER — ONDANSETRON HCL 4 MG/2ML IJ SOLN: 4.0000 mg | Freq: Four times a day (QID) | INTRAMUSCULAR | Status: DC | PRN

## 2022-03-06 MED ORDER — HEPARIN (PORCINE) IN NACL 1000-0.9 UT/500ML-% IV SOLN
INTRAVENOUS | Status: DC | PRN
  Administered 2022-03-06: 500 mL

## 2022-03-06 MED ORDER — LABETALOL HCL 5 MG/ML IV SOLN: 10.0000 mg | INTRAVENOUS | Status: DC | PRN

## 2022-03-06 MED ORDER — HEPARIN SODIUM (PORCINE) 1000 UNIT/ML IJ SOLN
INTRAMUSCULAR | Status: DC | PRN
  Administered 2022-03-06: 2000 [IU] via INTRAVENOUS
  Administered 2022-03-06: 5000 [IU] via INTRAVENOUS

## 2022-03-06 SURGICAL SUPPLY — 20 items
BALLN SIZING AMPLATZER 24 (BALLOONS) ×2
BALLOON SIZING AMPLATZER 24 (BALLOONS) IMPLANT
CATH ACUNAV REPROCESSED (CATHETERS) ×1 IMPLANT
CATH INFINITI 6F MPA2 100CM (CATHETERS) ×1 IMPLANT
CLOSURE PERCLOSE PROSTYLE (VASCULAR PRODUCTS) ×2 IMPLANT
COVER SWIFTLINK CONNECTOR (BAG) ×1 IMPLANT
GUIDEWIRE AMPLATZER 1.5JX260 (WIRE) ×1 IMPLANT
KIT HEART LEFT (KITS) ×2 IMPLANT
OCCLUDER AMPLATZER SEPTAL 12MM (Prosthesis & Implant Heart) ×1 IMPLANT
PACK CARDIAC CATHETERIZATION (CUSTOM PROCEDURE TRAY) ×2 IMPLANT
PROTECTION STATION PRESSURIZED (MISCELLANEOUS) ×2
SHEATH INTROD W/O MIN 9FR 25CM (SHEATH) ×1 IMPLANT
SHEATH PINNACLE 8F 10CM (SHEATH) ×1 IMPLANT
SHEATH PROBE COVER 6X72 (BAG) ×1 IMPLANT
STATION PROTECTION PRESSURIZED (MISCELLANEOUS) IMPLANT
SYS DELIVER AMP TREVISIO 8FR (SHEATH) ×2
SYSTEM DELIVER AMP TREVIS 8FR (SHEATH) IMPLANT
TRANSDUCER W/STOPCOCK (MISCELLANEOUS) ×2 IMPLANT
TUBING CIL FLEX 10 FLL-RA (TUBING) ×2 IMPLANT
WIRE EMERALD 3MM-J .035X150CM (WIRE) ×1 IMPLANT

## 2022-03-06 NOTE — Discharge Instructions (Signed)

## 2022-03-06 NOTE — Progress Notes (Signed)
Echocardiogram ?2D Echocardiogram has been performed. ?  ?Warren Lacy Bora Broner RDCS ?03/06/2022, 10:54 AM ?

## 2022-03-06 NOTE — Interval H&P Note (Signed)
History and Physical Interval Note: ? ?03/06/2022 ?8:38 AM ? ?Shirley Craig  has presented today for surgery, with the diagnosis of PFO.  The various methods of treatment have been discussed with the patient and family. After consideration of risks, benefits and other options for treatment, the patient has consented to  Procedure(s): ?PATENT FORAMEN OVALE(PFO) CLOSURE (N/A) as a surgical intervention.  The patient's history has been reviewed, patient examined, no change in status, stable for surgery.  I have reviewed the patient's chart and labs.  Questions were answered to the patient's satisfaction.   ? ? ?Tonny Bollman ? ? ?

## 2022-03-07 MED FILL — Heparin Sod (Porcine)-NaCl IV Soln 1000 Unit/500ML-0.9%: INTRAVENOUS | Qty: 500 | Status: AC

## 2022-03-25 ENCOUNTER — Ambulatory Visit: Payer: Self-pay

## 2022-04-02 NOTE — Progress Notes (Signed)
[facility]                                     Cardiology Office Note:    Date:  04/08/2022   ID:  Moncerrath Craig, DOB [birth year], MRN [id number]  PCP:  Carilyn Goodpasture, NP  [facility] Cardiologist:  Thomasene Ripple, DO / Dr. Excell Seltzer, MD (PFO)  Referring MD: Carilyn Goodpasture, NP   1 month s/p PFO  History of Present Illness:    Shirley Craig is a 56 y.o. female with a hx of cryptogenic stroke and PFO s/p PFO closure on 03/06/22 who presents to clinic for follow up.    Ms. Matura was referred to Dr. Excell Seltzer for the evaluation of PFO closure. [place] and shortly thereafter developed right arm numbness and tingling, along with facial numbness. She was seen in the hospital there and underwent further testing which was reportedly found to be normal. She was referred to neurology after returning home with extensive testing with a brain MRI which showed a small lacunar infarct in the right cerebellar hemisphere but otherwise was unremarkable. CT angiogram of the chest showed some aortic atherosclerosis.  A carotid ultrasound was unremarkable. An echo bubble study was positive for right to left intracardiac shunting. 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 There were no valvular abnormalities. LV and RV function were normal. On Dr. Earmon Phoenix evaluation, she had complaints of chest pain however no exertional symptoms. Plan at that time was to proceed with PFO closure scheduled for 03/06/22. Plan was to start Plavix  4-5 days prior to procedure and continue ASA and Plavix after implant for a period of 6 months.  She underwent PFO/ASD closure with a 12 mm atrial septal occluder device under fluoroscopic and intracardiac echo imaging on 03/26/22. There were no complications. Post procedure limited echo showed stable amplatzer PFO closure device with no color flow doppler residual shunt.    Today the patient presents to clinic for follow up. She has ongoing atypical CP that is unchanged. No SOB. No LE edema, orthopnea or PND. She has chronic dizziness that is unchanged but no syncope. No blood in stool or urine. Also has chronic palpitations that have been ongoing. Wore a heart monitor after CVA with no atrial fibrillation. She thinks this is related to anxiety. No new neurologic symptoms.    Past Medical History:  Diagnosis Date   History of bradycardia    History of palpitations    Hypertension    Hypothyroidism    Iron deficiency anemia    TIA (transient ischemic attack)    Vitamin D deficiency     Past Surgical History:  Procedure Laterality Date   BUBBLE STUDY  01/02/2022   Procedure: BUBBLE STUDY;  Surgeon: Wendall Stade, MD;  Location: [facility] ENDOSCOPY;  Service: Cardiovascular;;   PATENT FORAMEN OVALE(PFO) CLOSURE N/A 03/06/2022   Procedure: PATENT FORAMEN OVALE(PFO) CLOSURE;  Surgeon: Tonny Bollman, MD;  Location: [facility] INVASIVE CV LAB;  Service: Cardiovascular;  Laterality: N/A;   TEE WITHOUT CARDIOVERSION N/A 01/02/2022   Procedure: TRANSESOPHAGEAL ECHOCARDIOGRAM (TEE);  Surgeon: Wendall Stade, MD;  Location: [facility] ENDOSCOPY;  Service: Cardiovascular;  Laterality: N/A;    Current Medications: Current Meds  Medication Sig   acetaminophen (TYLENOL) 500 MG tablet Take 500 mg by mouth every 6 (six) hours as needed (  for pain.).   amoxicillin (AMOXIL) 500 MG tablet Take 4 tablets (2,000 mg total) by mouth as directed. 1 hour prior to dental work including cleanings   aspirin EC 81 MG tablet Take 81 mg by mouth in the morning. Swallow whole.   candesartan (ATACAND) 8 MG tablet Take 8 mg by mouth in the morning.   Cholecalciferol (VITAMIN D3 PO) Take 1 tablet by mouth in the morning.   clopidogrel (PLAVIX) 75 MG tablet Take 1 tablet (75 mg total) by mouth daily. DO NOT BEGIN TAKING MEDICATION UNTIL 5 DAYS PRIOR TO PROCEDURE   doxycycline (VIBRAMYCIN) 50 MG capsule Take 50  mg by mouth 2 (two) times daily.   FEROSUL 325 (65 Fe) MG tablet Take 325 mg by mouth in the morning.   metroNIDAZOLE (METROCREAM) 0.75 % cream Apply topically 2 (two) times daily.   rosuvastatin (CRESTOR) 10 MG tablet Take 10 mg by mouth every evening.   VITAMIN D PO Take by mouth.     Allergies:   Patient has no known allergies.   Social History   Socioeconomic History   Marital status: Married    Spouse name: Not on file   Number of children: 3   Years of education: 12   Highest education level: Not on file  Occupational History   Not on file  Tobacco Use   Smoking status: Never   Smokeless tobacco: Never  Vaping Use   Vaping Use: Never used  Substance and Sexual Activity   Alcohol use: Never   Drug use: Never   Sexual activity: Not on file  Other Topics Concern   Not on file  Social History Narrative   Right handed   Drinks caffeine   One story home   Social Determinants of Health   Financial Resource Strain: Not on file  Food Insecurity: Not on file  Transportation Needs: Not on file  Physical Activity: Not on file  Stress: Not on file  Social Connections: Not on file     Family History: The patient's family history includes Hypertension in her father and mother.  ROS:   Please see the history of present illness.    All other systems reviewed and are negative.  EKGs/Labs/Other Studies Reviewed:    The following studies were reviewed today:  ASD closure 03/06/22:  Successful transcatheter ASD closure using a 12 mm atrial septal occluder device under fluoroscopic and intracardiac echo imaging   Plan:  Dual antiplatelet therapy with aspirin and clopidogrel x6 months without interruption SBE prophylaxis x6 months Same-day discharge protocol after limited 2D echo   Limited echo s/p ASD closure 03/06/22:   1. Left ventricular ejection fraction, by estimation, is 60 to 65%. The  left ventricle has normal function. The left ventricle has no regional  wall  motion abnormalities.   2. Right ventricular systolic function is normal. The right ventricular  size is normal.   3. Aplatzer PFO closure device. No color flow Doppler residual shunt.   4. The mitral valve is normal in structure. No evidence of mitral valve  regurgitation. No evidence of mitral stenosis.   5. The aortic valve is normal in structure. Aortic valve regurgitation is  not visualized. No aortic stenosis is present.   6. The inferior vena cava is normal in size with greater than 50%  respiratory variability, suggesting right atrial pressure of 3 mmHg.    Echo TEE 01/02/22:   1. Large atrial septal aneurysm with 1.8 cm excursion into LA associated  with large PFO and left to right shunting on color flow. Very positve  bubble study with right to left shunting Discussed with Dr Excell Seltzer Refer  for closure given clinical history of  TIA.   2. Left ventricular ejection fraction, by estimation, is 60 to 65%. The  left ventricle has normal function. The left ventricle has no regional  wall motion abnormalities.   3. Right ventricular systolic function is normal. The right ventricular  size is normal.   4. No left atrial/left atrial appendage thrombus was detected.   5. The mitral valve is normal in structure. No evidence of mitral valve  regurgitation. No evidence of mitral stenosis.   6. The aortic valve is normal in structure. Aortic valve regurgitation is  not visualized. No aortic stenosis is present.   7. The inferior vena cava is normal in size with greater than 50%  respiratory variability, suggesting right atrial pressure of 3 mmHg.   8. Evidence of atrial level shunting detected by color flow Doppler.  Agitated saline contrast bubble study was positive with shunting observed  within 3-6 cardiac cycles suggestive of interatrial shunt.   Conclusion(s)/Recommendation(s): Normal biventricular function without  evidence of hemodynamically significant valvular heart disease.    ZIO monitor 01/22/22:   Patch Wear Time:  13 days and 22 hours (2023-03-03T14:25:06-499 to 2023-03-17T14:08:02-399)   Patient had a min HR of 44 bpm, max HR of 167 bpm, and avg HR of 80 bpm. Predominant underlying rhythm was Sinus Rhythm. First Degree AV Block was present.  Isolated SVEs were rare (<1.0%), SVE Couplets were rare (<1.0%), and SVE Triplets were rare (<1.0%). Isolated VEs were rare (<1.0%), VE Couplets were rare (<1.0%), and no VE Triplets were present.   Conclusion: Normal unremarkable study with no evidence of significant arrhythmia.   Echo with bubble 10/25/21:   1. Left ventricular ejection fraction, by estimation, is 60 to 65%. The  left ventricle has normal function. The left ventricle has no regional  wall motion abnormalities. Left ventricular diastolic parameters are  consistent with Grade I diastolic  dysfunction (impaired relaxation).   2. Right ventricular systolic function is normal. The right ventricular  size is normal. There is normal pulmonary artery systolic pressure.   3. The mitral valve is normal in structure. No evidence of mitral valve  regurgitation. No evidence of mitral stenosis.   4. The aortic valve is normal in structure. Aortic valve regurgitation is  not visualized. No aortic stenosis is present.   5. The inferior vena cava is normal in size with greater than 50%  respiratory variability, suggesting right atrial pressure of 3 mmHg.   6. Agitated saline contrast bubble study was positive with shunting  observed within 3-6 cardiac cycles suggestive of interatrial shunt. There  is a moderately sized patent foramen ovale with predominantly right to  left shunting across the atrial septum.     EKG:  EKG is NOT ordered today.   Recent Labs: 12/31/2021: Magnesium 2.2 02/27/2022: Platelets 369 03/06/2022: BUN 20; Creatinine, Ser 0.80; Hemoglobin 15.0; Potassium 4.1; Sodium 139  Recent Lipid Panel    Component Value Date/Time   CHOL 187  10/22/2010 2112   TRIG 96 10/22/2010 2112   HDL 44 10/22/2010 2112   CHOLHDL 4.3 Ratio 10/22/2010 2112   VLDL 19 10/22/2010 2112   LDLCALC 124 (H) 10/22/2010 2112    Physical Exam:    VS:  BP 124/80   Pulse 94   Ht 5\' 1"  (1.549 m)   Wt  158 lb 6.4 oz (71.8 kg)   LMP 11/25/2018   SpO2 96%   BMI 29.93 kg/m     Wt Readings from Last 3 Encounters:  04/08/22 158 lb 6.4 oz (71.8 kg)  03/06/22 157 lb (71.2 kg)  02/27/22 156 lb (70.8 kg)     GEN:  Well nourished, well developed in no acute distress HEENT: Normal NECK: No JVD; LYMPHATICS: No lymphadenopathy CARDIAC: RRR, no murmurs, rubs, gallops RESPIRATORY:  Clear to auscultation without rales, wheezing or rhonchi  ABDOMEN: Soft, non-tender, non-distended MUSCULOSKELETAL:  No edema; No deformity  SKIN: Warm and dry NEUROLOGIC:  Alert and oriented x 3 PSYCHIATRIC:  Normal affect   ASSESSMENT:    1. S/P percutaneous patent foramen ovale closure   2. History of CVA (cerebrovascular accident)   3. Palpitations    PLAN:    In order of problems listed above:  PFO s/p percutaneous PFO closure: she is doing well. Groin site healed up. She understands the need for SBE prophylaxis x 6 months (amoxicillin called in). She will continue on aspirin and plavix x 6 months and then continue on aspirin therapy indefinitely. I will see her back in 1 year with an echo with bubble  Hx of CVA: continue antiplatelets and statin.   Palpitations: this has been ongoing and not worse since PFO closure. She wore a 14 day monitor after her CVA that did not show afib. I offered a repeat monitor but she declined at this time.    Medication Adjustments/Labs and Tests Ordered: Current medicines are reviewed at length with the patient today.  Concerns regarding medicines are outlined above.  Orders Placed This Encounter  Procedures   ECHOCARDIOGRAM COMPLETE BUBBLE STUDY   Meds ordered this encounter  Medications   amoxicillin (AMOXIL) 500 MG  tablet    Sig: Take 4 tablets (2,000 mg total) by mouth as directed. 1 hour prior to dental work including cleanings    Dispense:  12 tablet    Refill:  12    Order Specific Question:   Supervising Provider    Answer:   Tonny Bollman [[id number]]    Patient Instructions  Medication Instructions:  Your physician has recommended you make the following change in your medication:  STOP PLAVIX O 09/06/22  *If you need a refill on your cardiac medications before your next appointment, please call your pharmacy*   Lab Work: NONE If you have labs (blood work) drawn today and your tests are completely normal, you will receive your results only by: MyChart Message (if you have MyChart) OR A paper copy in the mail If you have any lab test that is abnormal or we need to change your treatment, we will call you to review the results.   Testing/Procedures: Your physician has requested that you have an echocardiogram with bubble. Echocardiography is a painless test that uses sound waves to create images of your heart. It provides your doctor with information about the size and shape of your heart and how well your heart's chambers and valves are working. This procedure takes approximately one hour. There are no restrictions for this procedure.    Follow-Up: At [facility], you and your health needs are our priority.  As part of our continuing mission to provide you with exceptional heart care, we have created designated Provider Care Teams.  These Care Teams include your primary Cardiologist (physician) and Advanced Practice Providers (APPs -  Physician Assistants and Nurse Practitioners) who all work together to provide you  with the care you need, when you need it.  We recommend signing up for the patient portal called "MyChart".  Sign up information is provided on this After Visit Summary.  MyChart is used to connect with patients for Virtual Visits (Telemedicine).  Patients are able to view lab/test  results, encounter notes, upcoming appointments, etc.  Non-urgent messages can be sent to your provider as well.   To learn more about what you can do with MyChart, go to [email or web address].    Your next appointment:   KEEP SCHEDULED FOLLOW-UP  Important Information About Sugar         Leonides Schanz Cline Crock, PA-C  04/08/2022 11:52 AM    [facility]

## 2022-04-08 ENCOUNTER — Ambulatory Visit (INDEPENDENT_AMBULATORY_CARE_PROVIDER_SITE_OTHER): Payer: 59 | Admitting: Physician Assistant

## 2022-04-08 VITALS — BP 124/80 | HR 94 | Ht 61.0 in | Wt 158.4 lb

## 2022-04-08 DIAGNOSIS — Z8774 Personal history of (corrected) congenital malformations of heart and circulatory system: Secondary | ICD-10-CM | POA: Diagnosis not present

## 2022-04-08 DIAGNOSIS — Z8673 Personal history of transient ischemic attack (TIA), and cerebral infarction without residual deficits: Secondary | ICD-10-CM

## 2022-04-08 DIAGNOSIS — R002 Palpitations: Secondary | ICD-10-CM

## 2022-04-08 MED ORDER — AMOXICILLIN 500 MG PO TABS: 2000.0000 mg | ORAL_TABLET | ORAL | 12 refills | Status: DC

## 2022-04-08 NOTE — Patient Instructions (Signed)
Medication Instructions:  Your physician has recommended you make the following change in your medication:  STOP PLAVIX O 09/06/22  *If you need a refill on your cardiac medications before your next appointment, please call your pharmacy*   Lab Work: NONE If you have labs (blood work) drawn today and your tests are completely normal, you will receive your results only by: Onyx (if you have MyChart) OR A paper copy in the mail If you have any lab test that is abnormal or we need to change your treatment, we will call you to review the results.   Testing/Procedures: Your physician has requested that you have an echocardiogram with bubble. Echocardiography is a painless test that uses sound waves to create images of your heart. It provides your doctor with information about the size and shape of your heart and how well your heart's chambers and valves are working. This procedure takes approximately one hour. There are no restrictions for this procedure.    Follow-Up: At [facility], you and your health needs are our priority.  As part of our continuing mission to provide you with exceptional heart care, we have created designated Provider Care Teams.  These Care Teams include your primary Cardiologist (physician) and Advanced Practice Providers (APPs -  Physician Assistants and Nurse Practitioners) who all work together to provide you with the care you need, when you need it.  We recommend signing up for the patient portal called "MyChart".  Sign up information is provided on this After Visit Summary.  MyChart is used to connect with patients for Virtual Visits (Telemedicine).  Patients are able to view lab/test results, encounter notes, upcoming appointments, etc.  Non-urgent messages can be sent to your provider as well.   To learn more about what you can do with MyChart, go to [email or web address].    Your next appointment:   KEEP SCHEDULED FOLLOW-UP  Important Information  About Sugar

## 2022-05-08 ENCOUNTER — Encounter: Payer: Self-pay | Admitting: Cardiology

## 2022-05-08 ENCOUNTER — Ambulatory Visit (INDEPENDENT_AMBULATORY_CARE_PROVIDER_SITE_OTHER): Payer: 59 | Admitting: Cardiology

## 2022-05-08 ENCOUNTER — Telehealth: Payer: Self-pay

## 2022-05-08 VITALS — BP 128/86 | HR 76 | Ht 62.0 in | Wt 159.0 lb

## 2022-05-08 DIAGNOSIS — Z8774 Personal history of (corrected) congenital malformations of heart and circulatory system: Secondary | ICD-10-CM

## 2022-05-08 DIAGNOSIS — Z79899 Other long term (current) drug therapy: Secondary | ICD-10-CM | POA: Insufficient documentation

## 2022-05-08 DIAGNOSIS — R42 Dizziness and giddiness: Secondary | ICD-10-CM | POA: Insufficient documentation

## 2022-05-08 DIAGNOSIS — Q2112 Patent foramen ovale: Secondary | ICD-10-CM | POA: Diagnosis not present

## 2022-05-08 DIAGNOSIS — Z8673 Personal history of transient ischemic attack (TIA), and cerebral infarction without residual deficits: Secondary | ICD-10-CM | POA: Diagnosis not present

## 2022-05-08 NOTE — Patient Instructions (Addendum)
Medication Instructions:  Your physician recommends that you continue on your current medications as directed. Please refer to the Current Medication list given to you today.  *If you need a refill on your cardiac medications before your next appointment, please call your pharmacy*   Lab Work: Your physician recommends that you return for lab work in:  TODAY: CBC If you have labs (blood work) drawn today and your tests are completely normal, you will receive your results only by: MyChart Message (if you have MyChart) OR A paper copy in the mail If you have any lab test that is abnormal or we need to change your treatment, we will call you to review the results.   Testing/Procedures: None   Follow-Up: At [facility], you and your health needs are our priority.  As part of our continuing mission to provide you with exceptional heart care, we have created designated Provider Care Teams.  These Care Teams include your primary Cardiologist (physician) and Advanced Practice Providers (APPs -  Physician Assistants and Nurse Practitioners) who all work together to provide you with the care you need, when you need it.  We recommend signing up for the patient portal called "MyChart".  Sign up information is provided on this After Visit Summary.  MyChart is used to connect with patients for Virtual Visits (Telemedicine).  Patients are able to view lab/test results, encounter notes, upcoming appointments, etc.  Non-urgent messages can be sent to your provider as well.   To learn more about what you can do with MyChart, go to [email or web address].    Your next appointment:   9 month(s)  The format for your next appointment:   In Person  Provider:   Thomasene Ripple, DO     Other Instructions  ENT: Newman Pies, MD Address: [address], [place], [place] [address] Phone: [phone]   KardiaMobile [email or web address]        FDA-cleared, clinical  grade mobile EKG monitor: Shirley Craig is the most clinically-validated mobile EKG used by the world's leading cardiac care medical professionals With Basic service, know instantly if your heart rhythm is normal or if atrial fibrillation is detected, and email the last single EKG recording to yourself or your doctor Premium service, available for purchase through the Kardia app for $9.99 per month or $99 per year, includes unlimited history and storage of your EKG recordings, a monthly EKG summary report to share with your doctor, along with the ability to track your blood pressure, activity and weight Includes one KardiaMobile phone clip FREE SHIPPING: Standard delivery 1-3 business days. Orders placed by 11:00am PST will ship that afternoon. Otherwise, will ship next business day. All orders ship via [employer] from [place], [place]    [employer] - sending an EKG Download app and set up profile. Run EKG - by placing 1-2 fingers on the silver plates After EKG is complete - Download PDF  - Skip password (if you apply a password the provider will need it to view the EKG) Click share button (square with upward arrow) in bottom left corner To send: choose MyChart (first time log into MyChart)  Pop up window about sending ECG Click continue Choose type of message Choose provider Type subject and message Click send (EKG should be attached)  - To send additional EKGs in one message click the paperclip image and bottom of page to attach.     Important Information About Sugar

## 2022-05-08 NOTE — Progress Notes (Signed)
Cardiology Office Note:    Date:  05/08/2022   ID:  Shirley Craig, DOB [birth year], MRN [id number]  PCP:  Shirley Goodpasture, NP  Cardiologist:  Shirley Ripple, DO  Electrophysiologist:  None   Referring MD: Shirley Goodpasture, NP   Chief Complaint  Patient presents with   Follow-up    4 months.   Headache    History of Present Illness:    Shirley Craig is a 56 y.o. female with a hx of cryptogenic stroke and found to have a PFO.  I did refer the patient to our structural team.  She is now status post PFO closure with a 12 mm atrial septal occluder device on Mar 06, 2022.  She has had her follow-up with the team as well.  Today she tells me that since her procedure she has been doing well but recently she noticed that she is experiencing intermittent dizziness.  She describes this as a off-and-on sensation.  She states it starts with ringing in her ears.  Sometimes she will have a headache with it.  She has not had any syncope or presyncope episodes.  She has had 1 episode prior to her procedure where she tells me she had some palpitations which lasted for 5 days.  This has not recurred.    Past Medical History:  Diagnosis Date   History of bradycardia    History of palpitations    Hypertension    Hypothyroidism    Iron deficiency anemia    TIA (transient ischemic attack)    Vitamin D deficiency     Past Surgical History:  Procedure Laterality Date   BUBBLE STUDY  01/02/2022   Procedure: BUBBLE STUDY;  Surgeon: Shirley Stade, MD;  Location: [facility] ENDOSCOPY;  Service: Cardiovascular;;   PATENT FORAMEN OVALE(PFO) CLOSURE N/A 03/06/2022   Procedure: PATENT FORAMEN OVALE(PFO) CLOSURE;  Surgeon: Shirley Bollman, MD;  Location: [facility] INVASIVE CV LAB;  Service: Cardiovascular;  Laterality: N/A;   TEE WITHOUT CARDIOVERSION N/A 01/02/2022   Procedure: TRANSESOPHAGEAL ECHOCARDIOGRAM (TEE);  Surgeon: Shirley Stade, MD;  Location: [facility] ENDOSCOPY;  Service: Cardiovascular;  Laterality: N/A;     Current Medications: Current Meds  Medication Sig   acetaminophen (TYLENOL) 500 MG tablet Take 500 mg by mouth every 6 (six) hours as needed (for pain.).   aspirin EC 81 MG tablet Take 81 mg by mouth in the morning. Swallow whole.   candesartan (ATACAND) 8 MG tablet Take 8 mg by mouth in the morning.   Cholecalciferol (VITAMIN D3 PO) Take 1 tablet by mouth in the morning.   clopidogrel (PLAVIX) 75 MG tablet Take 1 tablet (75 mg total) by mouth daily. DO NOT BEGIN TAKING MEDICATION UNTIL 5 DAYS PRIOR TO PROCEDURE   FEROSUL 325 (65 Fe) MG tablet Take 325 mg by mouth in the morning.   metroNIDAZOLE (METROCREAM) 0.75 % cream Apply topically 2 (two) times daily.   rosuvastatin (CRESTOR) 10 MG tablet Take 10 mg by mouth every evening.   VITAMIN D PO Take by mouth.     Allergies:   Patient has no known allergies.   Social History   Socioeconomic History   Marital status: Married    Spouse name: Not on file   Number of children: 3   Years of education: 12   Highest education level: Not on file  Occupational History   Not on file  Tobacco Use   Smoking status: Never   Smokeless tobacco: Never  Vaping Use   Vaping Use: Never  used  Substance and Sexual Activity   Alcohol use: Never   Drug use: Never   Sexual activity: Not on file  Other Topics Concern   Not on file  Social History Narrative   Right handed   Drinks caffeine   One story home   Social Determinants of Health   Financial Resource Strain: Not on file  Food Insecurity: Not on file  Transportation Needs: Not on file  Physical Activity: Not on file  Stress: Not on file  Social Connections: Not on file     Family History: The patient's family history includes Hypertension in her father and mother.  ROS:   Review of Systems  Constitution: Negative for decreased appetite, fever and weight gain.  HENT: Negative for congestion, ear discharge, hoarse voice and sore throat.   Eyes: Negative for discharge,  redness, vision loss in right eye and visual halos.  Cardiovascular: Negative for chest pain, dyspnea on exertion, leg swelling, orthopnea and palpitations.  Respiratory: Negative for cough, hemoptysis, shortness of breath and snoring.   Endocrine: Negative for heat intolerance and polyphagia.  Hematologic/Lymphatic: Negative for bleeding problem. Does not bruise/bleed easily.  Skin: Negative for flushing, nail changes, rash and suspicious lesions.  Musculoskeletal: Negative for arthritis, joint pain, muscle cramps, myalgias, neck pain and stiffness.  Gastrointestinal: Negative for abdominal pain, bowel incontinence, diarrhea and excessive appetite.  Genitourinary: Negative for decreased libido, genital sores and incomplete emptying.  Neurological: Negative for brief paralysis, focal weakness, headaches and loss of balance.  Psychiatric/Behavioral: Negative for altered mental status, depression and suicidal ideas.  Allergic/Immunologic: Negative for HIV exposure and persistent infections.    EKGs/Labs/Other Studies Reviewed:    The following studies were reviewed today:   EKG: None today  Recent Labs: 12/31/2021: Magnesium 2.2 02/27/2022: Platelets 369 03/06/2022: BUN 20; Creatinine, Ser 0.80; Hemoglobin 15.0; Potassium 4.1; Sodium 139  Recent Lipid Panel    Component Value Date/Time   CHOL 187 10/22/2010 2112   TRIG 96 10/22/2010 2112   HDL 44 10/22/2010 2112   CHOLHDL 4.3 Ratio 10/22/2010 2112   VLDL 19 10/22/2010 2112   LDLCALC 124 (H) 10/22/2010 2112    Physical Exam:    VS:  BP 128/86 (BP Location: Left Arm, Patient Position: Sitting, Cuff Size: Normal)   Pulse 76   Ht 5\' 2"  (1.575 m)   Wt 159 lb (72.1 kg)   LMP 11/25/2018   BMI 29.08 kg/m     Wt Readings from Last 3 Encounters:  05/08/22 159 lb (72.1 kg)  04/08/22 158 lb 6.4 oz (71.8 kg)  03/06/22 157 lb (71.2 kg)     GEN: Well nourished, well developed in no acute distress HEENT: Normal NECK: No JVD; No  carotid bruits LYMPHATICS: No lymphadenopathy CARDIAC: S1S2 noted,RRR, no murmurs, rubs, gallops RESPIRATORY:  Clear to auscultation without rales, wheezing or rhonchi  ABDOMEN: Soft, non-tender, non-distended, +bowel sounds, no guarding. EXTREMITIES: No edema, No cyanosis, no clubbing MUSCULOSKELETAL:  No deformity  SKIN: Warm and dry NEUROLOGIC:  Alert and oriented x 3, non-focal PSYCHIATRIC:  Normal affect, good insight  ASSESSMENT:    1. Dizziness   2. Medication management   3. PFO (patent foramen ovale)   4. History of CVA (cerebrovascular accident)   5. S/P percutaneous patent foramen ovale closure    PLAN:     PFO-she is status post closure she is doing well.  She is currently on aspirin and Plavix which she should complete this dual antiplatelet therapy for 6 months.  And then will be on aspirin only indefinitely.  I discussed with the patient and her husband that she will need antibiotics prophylaxis for noncardiac procedures.  History of CVA, she is on antiplatelet therapy and also statin.  Palpitations-prior monitor does not show any evidence of atrial fibrillation.  I have asked the patient to get kardia mobile as the episodes were intermittent.  Dizziness-sounds like vertigo.  We will refer the patient to ENT.  We will get CBC today.  The patient is in agreement with the above plan. The patient left the office in stable condition.  The patient will follow up in 9 months   Medication Adjustments/Labs and Tests Ordered: Current medicines are reviewed at length with the patient today.  Concerns regarding medicines are outlined above.  Orders Placed This Encounter  Procedures   CBC with Differential/Platelet   Ambulatory referral to ENT   No orders of the defined types were placed in this encounter.   Patient Instructions  Medication Instructions:  Your physician recommends that you continue on your current medications as directed. Please refer to the Current  Medication list given to you today.  *If you need a refill on your cardiac medications before your next appointment, please call your pharmacy*   Lab Work: Your physician recommends that you return for lab work in:  TODAY: CBC If you have labs (blood work) drawn today and your tests are completely normal, you will receive your results only by: MyChart Message (if you have MyChart) OR A paper copy in the mail If you have any lab test that is abnormal or we need to change your treatment, we will call you to review the results.   Testing/Procedures: None   Follow-Up: At [facility], you and your health needs are our priority.  As part of our continuing mission to provide you with exceptional heart care, we have created designated Provider Care Teams.  These Care Teams include your primary Cardiologist (physician) and Advanced Practice Providers (APPs -  Physician Assistants and Nurse Practitioners) who all work together to provide you with the care you need, when you need it.  We recommend signing up for the patient portal called "MyChart".  Sign up information is provided on this After Visit Summary.  MyChart is used to connect with patients for Virtual Visits (Telemedicine).  Patients are able to view lab/test results, encounter notes, upcoming appointments, etc.  Non-urgent messages can be sent to your provider as well.   To learn more about what you can do with MyChart, go to [email or web address].    Your next appointment:   9 month(s)  The format for your next appointment:   In Person  Provider:   Thomasene Ripple, DO     Other Instructions  ENT: Newman Pies, MD Address: [address], [place], [place] [address] Phone: [phone]   KardiaMobile [email or web address]        FDA-cleared, clinical grade mobile EKG monitor: Lourena Simmonds is the most clinically-validated mobile EKG used by the world's leading cardiac care medical professionals  With Basic service, know instantly if your heart rhythm is normal or if atrial fibrillation is detected, and email the last single EKG recording to yourself or your doctor Premium service, available for purchase through the Kardia app for $9.99 per month or $99 per year, includes unlimited history and storage of your EKG recordings, a monthly EKG summary report to share with your doctor, along with the ability to track your blood pressure, activity and  weight Includes one KardiaMobile phone clip FREE SHIPPING: Standard delivery 1-3 business days. Orders placed by 11:00am PST will ship that afternoon. Otherwise, will ship next business day. All orders ship via [employer] from [place], [place]    [employer] - sending an EKG Download app and set up profile. Run EKG - by placing 1-2 fingers on the silver plates After EKG is complete - Download PDF  - Skip password (if you apply a password the provider will need it to view the EKG) Click share button (square with upward arrow) in bottom left corner To send: choose MyChart (first time log into MyChart)  Pop up window about sending ECG Click continue Choose type of message Choose provider Type subject and message Click send (EKG should be attached)  - To send additional EKGs in one message click the paperclip image and bottom of page to attach.     Important Information About Sugar         Adopting a Healthy Lifestyle.  Know what a healthy weight is for you (roughly BMI <25) and aim to maintain this   Aim for 7+ servings of fruits and vegetables daily   65-80+ fluid ounces of water or unsweet tea for healthy kidneys   Limit to max 1 drink of alcohol per day; avoid smoking/tobacco   Limit animal fats in diet for cholesterol and heart health - choose grass fed whenever available   Avoid highly processed foods, and foods high in saturated/trans fats   Aim for low stress - take time to unwind and care for your mental health   Aim  for 150 min of moderate intensity exercise weekly for heart health, and weights twice weekly for bone health   Aim for 7-9 hours of sleep daily   When it comes to diets, agreement about the perfect plan isnt easy to find, even among the experts. Experts at the [facility] [employer] developed an idea known as the Healthy Eating Plate. Just imagine a plate divided into logical, healthy portions.   The emphasis is on diet quality:   Load up on vegetables and fruits - one-half of your plate: Aim for color and variety, and remember that potatoes dont count.   Go for whole grains - one-quarter of your plate: Whole wheat, barley, wheat berries, quinoa, oats, brown rice, and foods made with them. If you want pasta, go with whole wheat pasta.   Protein power - one-quarter of your plate: Fish, chicken, beans, and nuts are all healthy, versatile protein sources. Limit red meat.   The diet, however, does go beyond the plate, offering a few other suggestions.   Use healthy plant oils, such as olive, canola, soy, corn, sunflower and peanut. Check the labels, and avoid partially hydrogenated oil, which have unhealthy trans fats.   If youre thirsty, drink water. Coffee and tea are good in moderation, but skip sugary drinks and limit milk and dairy products to one or two daily servings.   The type of carbohydrate in the diet is more important than the amount. Some sources of carbohydrates, such as vegetables, fruits, whole grains, and beans-are healthier than others.   Finally, stay active  Signed, Shirley Ripple, DO  05/08/2022 8:32 AM    [facility]

## 2022-05-08 NOTE — Telephone Encounter (Signed)
Encounter not needed

## 2022-05-09 LAB — CBC WITH DIFFERENTIAL/PLATELET
Basophils Absolute: 0.1 10*3/uL (ref 0.0–0.2)
Basos: 1 %
EOS (ABSOLUTE): 0.2 10*3/uL (ref 0.0–0.4)
Eos: 2 %
Hematocrit: 34.5 % (ref 34.0–46.6)
Hemoglobin: 11.4 g/dL (ref 11.1–15.9)
Immature Grans (Abs): 0 10*3/uL (ref 0.0–0.1)
Immature Granulocytes: 1 %
Lymphocytes Absolute: 2.1 10*3/uL (ref 0.7–3.1)
Lymphs: 33 %
MCH: 29.1 pg (ref 26.6–33.0)
MCHC: 33 g/dL (ref 31.5–35.7)
MCV: 88 fL (ref 79–97)
Monocytes Absolute: 0.6 10*3/uL (ref 0.1–0.9)
Monocytes: 9 %
Neutrophils Absolute: 3.6 10*3/uL (ref 1.4–7.0)
Neutrophils: 54 %
Platelets: 435 10*3/uL (ref 150–450)
RBC: 3.92 x10E6/uL (ref 3.77–5.28)
RDW: 13.5 % (ref 11.7–15.4)
WBC: 6.5 10*3/uL (ref 3.4–10.8)

## 2023-03-04 DIAGNOSIS — G4733 Obstructive sleep apnea (adult) (pediatric): Secondary | ICD-10-CM | POA: Diagnosis not present

## 2023-03-12 ENCOUNTER — Ambulatory Visit (HOSPITAL_COMMUNITY): Payer: BLUE CROSS/BLUE SHIELD

## 2023-03-12 ENCOUNTER — Ambulatory Visit: Payer: BLUE CROSS/BLUE SHIELD

## 2023-03-27 ENCOUNTER — Other Ambulatory Visit: Payer: Self-pay | Admitting: Physician Assistant

## 2023-03-27 DIAGNOSIS — Z8774 Personal history of (corrected) congenital malformations of heart and circulatory system: Secondary | ICD-10-CM

## 2023-03-27 NOTE — Progress Notes (Unsigned)
[facility]                                     Cardiology Office Note:    Date:  04/07/2023   ID:  Shirley Craig, DOB [birth year], MRN [id number]  PCP:  Carilyn Goodpasture, NP  [facility] Cardiologist:  Thomasene Ripple, DO / Dr. Excell Seltzer, MD (PFO)  Referring MD: Carilyn Goodpasture, NP   1 year s/p PFO  History of Present Illness:    Shirley Craig is a 57 y.o. female with a hx of chronic chest pain/palpitations, cryptogenic stroke and PFO s/p PFO closure on 03/06/22 who presents to clinic for follow up.    Shirley Craig was referred to Dr. Excell Seltzer for the evaluation of PFO closure. [place] and shortly thereafter developed right arm numbness and tingling, along with facial numbness. She was seen in the hospital there and underwent further testing which was reportedly found to be normal. She was referred to neurology after returning home with extensive testing with a brain MRI which showed a small lacunar infarct in the right cerebellar hemisphere but otherwise was unremarkable. CT angiogram of the chest showed some aortic atherosclerosis.  A carotid ultrasound was unremarkable. An echo bubble study was positive for right to left intracardiac shunting. 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 There were no valvular abnormalities. LV and RV function were normal. On Dr. Earmon Phoenix evaluation, she had complaints of chest pain however no exertional symptoms. Plan at that time was to proceed with PFO closure scheduled for 03/06/22.  She underwent PFO/ASD closure with a 12 mm atrial septal occluder device under fluoroscopic and intracardiac echo imaging on 03/26/22. There were no complications. Post procedure limited echo showed stable amplatzer PFO closure device with no color flow doppler residual shunt. She was discharged on aspirin and plavix x 6 months.  Today the patient presents to clinic for  follow up. Here with husband. No CP. Has some SOB when laying flat wearing CPAP. Recently had it readjusted. No LE edema, orthopnea or PND. No dizziness or syncope. No blood in stool or urine. No palpitations. An interpreter was used during our visit.   Past Medical History:  Diagnosis Date   History of bradycardia    History of palpitations    Hypertension    Hypothyroidism    Iron deficiency anemia    TIA (transient ischemic attack)    Vitamin D deficiency     Past Surgical History:  Procedure Laterality Date   BUBBLE STUDY  01/02/2022   Procedure: BUBBLE STUDY;  Surgeon: Wendall Stade, MD;  Location: [facility] ENDOSCOPY;  Service: Cardiovascular;;   PATENT FORAMEN OVALE(PFO) CLOSURE N/A 03/06/2022   Procedure: PATENT FORAMEN OVALE(PFO) CLOSURE;  Surgeon: Tonny Bollman, MD;  Location: [facility] INVASIVE CV LAB;  Service: Cardiovascular;  Laterality: N/A;   TEE WITHOUT CARDIOVERSION N/A 01/02/2022   Procedure: TRANSESOPHAGEAL ECHOCARDIOGRAM (TEE);  Surgeon: Wendall Stade, MD;  Location: [facility] ENDOSCOPY;  Service: Cardiovascular;  Laterality: N/A;    Current Medications: Current Meds  Medication Sig   acetaminophen (TYLENOL) 500 MG tablet Take 500 mg by mouth every 6 (six) hours as needed (for pain.).   aspirin EC 81 MG tablet Take 81 mg by mouth in the morning. Swallow whole.   candesartan (ATACAND) 8 MG tablet Take 8 mg by mouth in  the morning.   Cholecalciferol (VITAMIN D3 PO) Take 1 tablet by mouth in the morning.   FEROSUL 325 (65 Fe) MG tablet Take 325 mg by mouth in the morning.   rosuvastatin (CRESTOR) 10 MG tablet Take 10 mg by mouth every evening.   VITAMIN D PO Take by mouth.     Allergies:   Patient has no known allergies.   Social History   Socioeconomic History   Marital status: Married    Spouse name: Not on file   Number of children: 3   Years of education: 12   Highest education level: Not on file  Occupational History   Not on file  Tobacco Use   Smoking status: Never    Smokeless tobacco: Never  Vaping Use   Vaping Use: Never used  Substance and Sexual Activity   Alcohol use: Never   Drug use: Never   Sexual activity: Not on file  Other Topics Concern   Not on file  Social History Narrative   Right handed   Drinks caffeine   One story home   Social Determinants of Health   Financial Resource Strain: Not on file  Food Insecurity: Not on file  Transportation Needs: Not on file  Physical Activity: Not on file  Stress: Not on file  Social Connections: Not on file     Family History: The patient's family history includes Hypertension in her father and mother.  ROS:   Please see the history of present illness.    All other systems reviewed and are negative.  EKGs/Labs/Other Studies Reviewed:    The following studies were reviewed today:  ASD closure 03/06/22:  Successful transcatheter ASD closure using a 12 mm atrial septal occluder device under fluoroscopic and intracardiac echo imaging   Plan:  Dual antiplatelet therapy with aspirin and clopidogrel x6 months without interruption SBE prophylaxis x6 months Same-day discharge protocol after limited 2D echo   Limited echo s/p ASD closure 03/06/22:   1. Left ventricular ejection fraction, by estimation, is 60 to 65%. The  left ventricle has normal function. The left ventricle has no regional  wall motion abnormalities.   2. Right ventricular systolic function is normal. The right ventricular  size is normal.   3. Aplatzer PFO closure device. No color flow Doppler residual shunt.   4. The mitral valve is normal in structure. No evidence of mitral valve  regurgitation. No evidence of mitral stenosis.   5. The aortic valve is normal in structure. Aortic valve regurgitation is  not visualized. No aortic stenosis is present.   6. The inferior vena cava is normal in size with greater than 50%  respiratory variability, suggesting right atrial pressure of 3 mmHg.    Echo TEE 01/02/22:   1.  Large atrial septal aneurysm with 1.8 cm excursion into LA associated  with large PFO and left to right shunting on color flow. Very positve  bubble study with right to left shunting Discussed with Dr Excell Seltzer Refer  for closure given clinical history of  TIA.   2. Left ventricular ejection fraction, by estimation, is 60 to 65%. The  left ventricle has normal function. The left ventricle has no regional  wall motion abnormalities.   3. Right ventricular systolic function is normal. The right ventricular  size is normal.   4. No left atrial/left atrial appendage thrombus was detected.   5. The mitral valve is normal in structure. No evidence of mitral valve  regurgitation. No evidence of mitral  stenosis.   6. The aortic valve is normal in structure. Aortic valve regurgitation is  not visualized. No aortic stenosis is present.   7. The inferior vena cava is normal in size with greater than 50%  respiratory variability, suggesting right atrial pressure of 3 mmHg.   8. Evidence of atrial level shunting detected by color flow Doppler.  Agitated saline contrast bubble study was positive with shunting observed  within 3-6 cardiac cycles suggestive of interatrial shunt.   Conclusion(s)/Recommendation(s): Normal biventricular function without  evidence of hemodynamically significant valvular heart disease.   ZIO monitor 01/22/22:   Patch Wear Time:  13 days and 22 hours (2023-03-03T14:25:06-499 to 2023-03-17T14:08:02-399)   Patient had a min HR of 44 bpm, max HR of 167 bpm, and avg HR of 80 bpm. Predominant underlying rhythm was Sinus Rhythm. First Degree AV Block was present.  Isolated SVEs were rare (<1.0%), SVE Couplets were rare (<1.0%), and SVE Triplets were rare (<1.0%). Isolated VEs were rare (<1.0%), VE Couplets were rare (<1.0%), and no VE Triplets were present.   Conclusion: Normal unremarkable study with no evidence of significant arrhythmia.   Echo with bubble 10/25/21:   1. Left  ventricular ejection fraction, by estimation, is 60 to 65%. The  left ventricle has normal function. The left ventricle has no regional  wall motion abnormalities. Left ventricular diastolic parameters are  consistent with Grade I diastolic  dysfunction (impaired relaxation).   2. Right ventricular systolic function is normal. The right ventricular  size is normal. There is normal pulmonary artery systolic pressure.   3. The mitral valve is normal in structure. No evidence of mitral valve  regurgitation. No evidence of mitral stenosis.   4. The aortic valve is normal in structure. Aortic valve regurgitation is  not visualized. No aortic stenosis is present.   5. The inferior vena cava is normal in size with greater than 50%  respiratory variability, suggesting right atrial pressure of 3 mmHg.   6. Agitated saline contrast bubble study was positive with shunting  observed within 3-6 cardiac cycles suggestive of interatrial shunt. There  is a moderately sized patent foramen ovale with predominantly right to  left shunting across the atrial septum.   ____________________  Limited Echo 03/28/23 IMPRESSIONS   1. Left ventricular ejection fraction, by estimation, is 55 to 60%. The  left ventricle has normal function. The left ventricle has no regional  wall motion abnormalities.   2. Right ventricular systolic function is normal. The right ventricular  size is normal. There is normal pulmonary artery systolic pressure.   3. The mitral valve is normal in structure. Trivial mitral valve  regurgitation. No evidence of mitral stenosis.   4. The aortic valve is normal in structure. Aortic valve regurgitation is  not visualized. No aortic stenosis is present.   5. The inferior vena cava is normal in size with greater than 50%  respiratory variability, suggesting right atrial pressure of 3 mmHg.     EKG:  EKG is NOT ordered today.   Recent Labs: 05/08/2022: Hemoglobin 11.4; Platelets 435   Recent Lipid Panel    Component Value Date/Time   CHOL 187 10/22/2010 2112   TRIG 96 10/22/2010 2112   HDL 44 10/22/2010 2112   CHOLHDL 4.3 Ratio 10/22/2010 2112   VLDL 19 10/22/2010 2112   LDLCALC 124 (H) 10/22/2010 2112    Physical Exam:    VS:  BP 130/65   Pulse 83   Ht 5\' 2"  (1.575 m)  Wt 166 lb 4 oz (75.4 kg)   LMP 11/25/2018   SpO2 99%   BMI 30.41 kg/m     Wt Readings from Last 3 Encounters:  04/04/23 166 lb 4 oz (75.4 kg)  05/08/22 159 lb (72.1 kg)  04/08/22 158 lb 6.4 oz (71.8 kg)     GEN:  Well nourished, well developed in no acute distress HEENT: Normal NECK: No JVD; LYMPHATICS: No lymphadenopathy CARDIAC: RRR, no murmurs, rubs, gallops RESPIRATORY:  Clear to auscultation without rales, wheezing or rhonchi  ABDOMEN: Soft, non-tender, non-distended MUSCULOSKELETAL:  No edema; No deformity  SKIN: Warm and dry NEUROLOGIC:  Alert and oriented x 3 PSYCHIATRIC:  Normal affect   ASSESSMENT:    1. S/P percutaneous patent foramen ovale closure    PLAN:    In order of problems listed above:  PFO s/p percutaneous PFO closure: limited echo was planned with bubble study today but tech was unable to get IV access for bubble study. Limited echo was normal. She completed 6 months of DAPT. She will continue on aspirin therapy indefinitely. She can follow with [place] as needed.   Of note, BP initially high but 130/65 on my personal recheck    Medication Adjustments/Labs and Tests Ordered: Current medicines are reviewed at length with the patient today.  Concerns regarding medicines are outlined above.  No orders of the defined types were placed in this encounter.  No orders of the defined types were placed in this encounter.   Patient Instructions  Medication Instructions:  Your physician has recommended you make the following change in your medication:  STOP PLAVIX STAY ON ASPIRIN   *If you need a refill on your cardiac medications before your next  appointment, please call your pharmacy*   Lab Work: NONE If you have labs (blood work) drawn today and your tests are completely normal, you will receive your results only by: MyChart Message (if you have MyChart) OR A paper copy in the mail If you have any lab test that is abnormal or we need to change your treatment, we will call you to review the results.   Testing/Procedures: NONE   Follow-Up: At [facility], you and your health needs are our priority.  As part of our continuing mission to provide you with exceptional heart care, we have created designated Provider Care Teams.  These Care Teams include your primary Cardiologist (physician) and Advanced Practice Providers (APPs -  Physician Assistants and Nurse Practitioners) who all work together to provide you with the care you need, when you need it.  We recommend signing up for the patient portal called "MyChart".  Sign up information is provided on this After Visit Summary.  MyChart is used to connect with patients for Virtual Visits (Telemedicine).  Patients are able to view lab/test results, encounter notes, upcoming appointments, etc.  Non-urgent messages can be sent to your provider as well.   To learn more about what you can do with MyChart, go to [email or web address].    Your next appointment:   AS NEEDED   Signed, Cline Crock, PA-C  04/07/2023 9:51 AM    [facility]

## 2023-04-04 ENCOUNTER — Ambulatory Visit: Payer: BLUE CROSS/BLUE SHIELD | Attending: Physician Assistant | Admitting: Physician Assistant

## 2023-04-04 ENCOUNTER — Other Ambulatory Visit: Payer: Self-pay | Admitting: Physician Assistant

## 2023-04-04 ENCOUNTER — Ambulatory Visit (HOSPITAL_COMMUNITY): Payer: BLUE CROSS/BLUE SHIELD | Attending: Cardiology

## 2023-04-04 VITALS — BP 130/65 | HR 83 | Ht 62.0 in | Wt 166.2 lb

## 2023-04-04 DIAGNOSIS — Z8774 Personal history of (corrected) congenital malformations of heart and circulatory system: Secondary | ICD-10-CM | POA: Diagnosis not present

## 2023-04-04 DIAGNOSIS — Q2112 Patent foramen ovale: Secondary | ICD-10-CM | POA: Diagnosis not present

## 2023-04-04 DIAGNOSIS — Z8673 Personal history of transient ischemic attack (TIA), and cerebral infarction without residual deficits: Secondary | ICD-10-CM

## 2023-04-04 DIAGNOSIS — R002 Palpitations: Secondary | ICD-10-CM

## 2023-04-04 LAB — ECHOCARDIOGRAM LIMITED
Area-P 1/2: 3.45 cm2
S' Lateral: 2.7 cm

## 2023-04-04 NOTE — Patient Instructions (Signed)
Medication Instructions:  Your physician has recommended you make the following change in your medication:  STOP PLAVIX STAY ON ASPIRIN   *If you need a refill on your cardiac medications before your next appointment, please call your pharmacy*   Lab Work: NONE If you have labs (blood work) drawn today and your tests are completely normal, you will receive your results only by: MyChart Message (if you have MyChart) OR A paper copy in the mail If you have any lab test that is abnormal or we need to change your treatment, we will call you to review the results.   Testing/Procedures: NONE   Follow-Up: At [facility], you and your health needs are our priority.  As part of our continuing mission to provide you with exceptional heart care, we have created designated Provider Care Teams.  These Care Teams include your primary Cardiologist (physician) and Advanced Practice Providers (APPs -  Physician Assistants and Nurse Practitioners) who all work together to provide you with the care you need, when you need it.  We recommend signing up for the patient portal called "MyChart".  Sign up information is provided on this After Visit Summary.  MyChart is used to connect with patients for Virtual Visits (Telemedicine).  Patients are able to view lab/test results, encounter notes, upcoming appointments, etc.  Non-urgent messages can be sent to your provider as well.   To learn more about what you can do with MyChart, go to [email or web address].    Your next appointment:   AS NEEDED

## 2023-09-04 DIAGNOSIS — G4733 Obstructive sleep apnea (adult) (pediatric): Secondary | ICD-10-CM | POA: Diagnosis not present

## 2023-09-11 DIAGNOSIS — Z0000 Encounter for general adult medical examination without abnormal findings: Secondary | ICD-10-CM | POA: Diagnosis not present

## 2023-09-11 DIAGNOSIS — I10 Essential (primary) hypertension: Secondary | ICD-10-CM | POA: Diagnosis not present

## 2023-09-11 DIAGNOSIS — E782 Mixed hyperlipidemia: Secondary | ICD-10-CM | POA: Diagnosis not present

## 2023-09-11 DIAGNOSIS — Z1231 Encounter for screening mammogram for malignant neoplasm of breast: Secondary | ICD-10-CM | POA: Diagnosis not present

## 2023-09-11 DIAGNOSIS — D508 Other iron deficiency anemias: Secondary | ICD-10-CM | POA: Diagnosis not present

## 2023-09-19 DIAGNOSIS — Z1231 Encounter for screening mammogram for malignant neoplasm of breast: Secondary | ICD-10-CM | POA: Diagnosis not present

## 2023-12-12 DIAGNOSIS — G4733 Obstructive sleep apnea (adult) (pediatric): Secondary | ICD-10-CM | POA: Diagnosis not present

## 2024-03-10 DIAGNOSIS — J029 Acute pharyngitis, unspecified: Secondary | ICD-10-CM | POA: Diagnosis not present

## 2024-03-10 DIAGNOSIS — E782 Mixed hyperlipidemia: Secondary | ICD-10-CM | POA: Diagnosis not present

## 2024-03-10 DIAGNOSIS — N841 Polyp of cervix uteri: Secondary | ICD-10-CM | POA: Diagnosis not present

## 2024-03-10 DIAGNOSIS — I10 Essential (primary) hypertension: Secondary | ICD-10-CM | POA: Diagnosis not present
